# Patient Record
Sex: Male | Born: 1990
Health system: Southern US, Community
[De-identification: ages and names within clinical notes are randomized; demographics above are authoritative.]

## PROBLEM LIST (undated history)

## (undated) DIAGNOSIS — F419 Anxiety disorder, unspecified: Secondary | ICD-10-CM

## (undated) DIAGNOSIS — G809 Cerebral palsy, unspecified: Secondary | ICD-10-CM

## (undated) DIAGNOSIS — F32A Depression, unspecified: Secondary | ICD-10-CM

## (undated) DIAGNOSIS — R569 Unspecified convulsions: Secondary | ICD-10-CM

## (undated) DIAGNOSIS — F411 Generalized anxiety disorder: Secondary | ICD-10-CM

## (undated) DIAGNOSIS — F0781 Postconcussional syndrome: Secondary | ICD-10-CM

## (undated) HISTORY — DX: Postconcussional syndrome: F07.81

## (undated) HISTORY — DX: Anxiety disorder, unspecified: F41.9

## (undated) HISTORY — PX: DEEP BRAIN STIMULATOR PLACEMENT: SHX608

## (undated) HISTORY — DX: Generalized anxiety disorder: F41.1

## (undated) HISTORY — DX: Depression, unspecified: F32.A

## (undated) HISTORY — DX: Unspecified convulsions: R56.9

---
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
## 2008-10-20 ENCOUNTER — Emergency Department (HOSPITAL_COMMUNITY): Admission: EM | Admit: 2008-10-20 | Discharge: 2008-10-21 | Payer: Self-pay | Admitting: Emergency Medicine

## 2008-11-01 ENCOUNTER — Emergency Department (HOSPITAL_COMMUNITY): Admission: EM | Admit: 2008-11-01 | Discharge: 2008-11-01 | Payer: Self-pay | Admitting: Emergency Medicine

## 2008-11-02 ENCOUNTER — Emergency Department (HOSPITAL_COMMUNITY): Admission: EM | Admit: 2008-11-02 | Discharge: 2008-11-02 | Payer: Self-pay | Admitting: Emergency Medicine

## 2008-11-12 ENCOUNTER — Emergency Department (HOSPITAL_COMMUNITY): Admission: EM | Admit: 2008-11-12 | Discharge: 2008-11-12 | Payer: Self-pay | Admitting: Emergency Medicine

## 2009-05-19 ENCOUNTER — Encounter: Admission: RE | Admit: 2009-05-19 | Discharge: 2009-05-19 | Payer: Self-pay | Admitting: Neurology

## 2010-01-04 ENCOUNTER — Inpatient Hospital Stay (HOSPITAL_COMMUNITY): Admission: EM | Admit: 2010-01-04 | Discharge: 2010-01-05 | Payer: Self-pay | Admitting: Emergency Medicine

## 2010-09-10 ENCOUNTER — Encounter: Payer: Self-pay | Admitting: Neurology

## 2010-11-05 LAB — BASIC METABOLIC PANEL
BUN: 20 mg/dL (ref 6–23)
CO2: 28 mEq/L (ref 19–32)
Calcium: 9.4 mg/dL (ref 8.4–10.5)
Chloride: 110 mEq/L (ref 96–112)
Creatinine, Ser: 0.91 mg/dL (ref 0.4–1.5)
GFR calc Af Amer: >60 mL/min (ref >60)
GFR calc non Af Amer: >60 mL/min (ref >60)
Glucose, Bld: 76 mg/dL (ref 70–99)
Potassium: 4.3 mEq/L (ref 3.5–5.1)
Sodium: 141 mEq/L (ref 135–145)

## 2010-11-05 LAB — DIFFERENTIAL
Basophils Absolute: 0 10*3/uL (ref 0.0–0.1)
Basophils Relative: 0 % (ref 0–1)
Eosinophils Absolute: 0 10*3/uL (ref 0.0–0.7)
Eosinophils Relative: 1 % (ref 0–5)
Lymphocytes Relative: 24 % (ref 12–46)
Lymphs Abs: 2.1 10*3/uL (ref 0.7–4.0)
Monocytes Absolute: 0.4 10*3/uL (ref 0.1–1.0)
Monocytes Relative: 5 % (ref 3–12)
Neutro Abs: 6 10*3/uL (ref 1.7–7.7)
Neutrophils Relative %: 70 % (ref 43–77)

## 2010-11-05 LAB — CBC
HCT: 44.5 % (ref 39.0–52.0)
Hemoglobin: 14.9 g/dL (ref 13.0–17.0)
MCHC: 33.6 g/dL (ref 30.0–36.0)
MCV: 96.1 fL (ref 78.0–100.0)
Platelets: 293 10*3/uL (ref 150–400)
RBC: 4.62 MIL/uL (ref 4.22–5.81)
RDW: 13.1 % (ref 11.5–15.5)
WBC: 8.5 10*3/uL (ref 4.0–10.5)

## 2010-11-29 LAB — COMPREHENSIVE METABOLIC PANEL
ALT: 41 U/L (ref 0–53)
AST: 44 U/L — ABNORMAL HIGH (ref 0–37)
Albumin: 4.3 g/dL (ref 3.5–5.2)
Alkaline Phosphatase: 79 U/L (ref 39–117)
BUN: 17 mg/dL (ref 6–23)
CO2: 30 mEq/L (ref 19–32)
Calcium: 9.9 mg/dL (ref 8.4–10.5)
Chloride: 104 mEq/L (ref 96–112)
Creatinine, Ser: 0.83 mg/dL (ref 0.4–1.5)
GFR calc Af Amer: >60 mL/min (ref >60)
GFR calc non Af Amer: >60 mL/min (ref >60)
Glucose, Bld: 84 mg/dL (ref 70–99)
Potassium: 4.7 mEq/L (ref 3.5–5.1)
Sodium: 139 mEq/L (ref 135–145)
Total Bilirubin: 2.3 mg/dL — ABNORMAL HIGH (ref 0.3–1.2)
Total Protein: 7.2 g/dL (ref 6.0–8.3)

## 2010-11-29 LAB — DIFFERENTIAL
Basophils Absolute: 0 10*3/uL (ref 0.0–0.1)
Basophils Relative: 0 % (ref 0–1)
Eosinophils Absolute: 0.1 10*3/uL (ref 0.0–0.7)
Eosinophils Relative: 1 % (ref 0–5)
Lymphocytes Relative: 24 % (ref 12–46)
Lymphs Abs: 1.9 10*3/uL (ref 0.7–4.0)
Monocytes Absolute: 0.6 10*3/uL (ref 0.1–1.0)
Monocytes Relative: 8 % (ref 3–12)
Neutro Abs: 5.4 10*3/uL (ref 1.7–7.7)
Neutrophils Relative %: 68 % (ref 43–77)

## 2010-11-29 LAB — CBC
HCT: 45.3 % (ref 39.0–52.0)
Hemoglobin: 15.4 g/dL (ref 13.0–17.0)
MCHC: 34 g/dL (ref 30.0–36.0)
MCV: 94.8 fL (ref 78.0–100.0)
Platelets: 340 10*3/uL (ref 150–400)
RBC: 4.78 MIL/uL (ref 4.22–5.81)
RDW: 12.9 % (ref 11.5–15.5)
WBC: 8 10*3/uL (ref 4.0–10.5)

## 2010-11-29 LAB — AMMONIA: Ammonia: 28 umol/L (ref 11–35)

## 2010-11-29 LAB — BILIRUBIN, DIRECT: Bilirubin, Direct: 0.2 mg/dL (ref 0.0–0.3)

## 2010-11-29 LAB — CK: Total CK: 824 U/L — ABNORMAL HIGH (ref 7–232)

## 2010-11-29 LAB — MAGNESIUM: Magnesium: 2.1 mg/dL (ref 1.5–2.5)

## 2010-12-11 ENCOUNTER — Emergency Department (HOSPITAL_COMMUNITY)
Admission: EM | Admit: 2010-12-11 | Discharge: 2010-12-11 | Disposition: A | Discharge status: Home / Self Care | Payer: No Typology Code available for payment source | Attending: Emergency Medicine | Admitting: Emergency Medicine

## 2010-12-11 ENCOUNTER — Emergency Department (HOSPITAL_COMMUNITY): Payer: No Typology Code available for payment source

## 2010-12-11 DIAGNOSIS — IMO0002 Reserved for concepts with insufficient information to code with codable children: Secondary | ICD-10-CM | POA: Insufficient documentation

## 2010-12-11 DIAGNOSIS — G809 Cerebral palsy, unspecified: Secondary | ICD-10-CM | POA: Insufficient documentation

## 2010-12-11 DIAGNOSIS — F411 Generalized anxiety disorder: Secondary | ICD-10-CM | POA: Insufficient documentation

## 2010-12-11 DIAGNOSIS — Y9289 Other specified places as the place of occurrence of the external cause: Secondary | ICD-10-CM | POA: Insufficient documentation

## 2012-02-07 ENCOUNTER — Encounter (HOSPITAL_COMMUNITY): Payer: Self-pay | Admitting: Emergency Medicine

## 2012-02-07 ENCOUNTER — Emergency Department (HOSPITAL_COMMUNITY)
Admission: EM | Admit: 2012-02-07 | Discharge: 2012-02-07 | Disposition: A | Discharge status: Home / Self Care | Payer: BC Managed Care – PPO | Attending: Emergency Medicine | Admitting: Emergency Medicine

## 2012-02-07 DIAGNOSIS — G809 Cerebral palsy, unspecified: Secondary | ICD-10-CM | POA: Insufficient documentation

## 2012-02-07 DIAGNOSIS — Z79899 Other long term (current) drug therapy: Secondary | ICD-10-CM | POA: Insufficient documentation

## 2012-02-07 DIAGNOSIS — M79609 Pain in unspecified limb: Secondary | ICD-10-CM | POA: Insufficient documentation

## 2012-02-07 DIAGNOSIS — IMO0001 Reserved for inherently not codable concepts without codable children: Secondary | ICD-10-CM | POA: Insufficient documentation

## 2012-02-07 DIAGNOSIS — M791 Myalgia, unspecified site: Secondary | ICD-10-CM

## 2012-02-07 DIAGNOSIS — R209 Unspecified disturbances of skin sensation: Secondary | ICD-10-CM | POA: Insufficient documentation

## 2012-02-07 DIAGNOSIS — M62838 Other muscle spasm: Secondary | ICD-10-CM | POA: Insufficient documentation

## 2012-02-07 HISTORY — DX: Cerebral palsy, unspecified: G80.9

## 2012-02-07 LAB — BASIC METABOLIC PANEL
BUN: 9 mg/dL (ref 6–23)
CO2: 30 mEq/L (ref 19–32)
Calcium: 9.6 mg/dL (ref 8.4–10.5)
Chloride: 101 mEq/L (ref 96–112)
Creatinine, Ser: 0.87 mg/dL (ref 0.50–1.35)
GFR calc Af Amer: >90 mL/min (ref >90)
GFR calc non Af Amer: >90 mL/min (ref >90)
Glucose, Bld: 93 mg/dL (ref 70–99)
Potassium: 4.3 mEq/L (ref 3.5–5.1)
Sodium: 138 mEq/L (ref 135–145)

## 2012-02-07 MED ORDER — OXYCODONE-ACETAMINOPHEN 5-325 MG PO TABS
2.0000 | ORAL_TABLET | Freq: Once | ORAL | Status: AC
  Administered 2012-02-07: 2 via ORAL
  Filled 2012-02-07: qty 2

## 2012-02-07 MED ORDER — OXYCODONE-ACETAMINOPHEN 5-325 MG PO TABS: 2.0000 | ORAL_TABLET | ORAL | Status: AC | PRN

## 2012-02-07 NOTE — Progress Notes (Signed)
Pt confirmed pcp as Eddie Valentine

## 2012-02-07 NOTE — ED Notes (Signed)
FAO:[id number]<MV> Expected date:<BR> Expected time: 3:00 PM<BR> Means of arrival:Ambulance<BR> Comments:<BR> 20yo M-Hx cerebral palsy, numbness in hands

## 2012-02-07 NOTE — Discharge Instructions (Signed)
Musculoskeletal Pain   Musculoskeletal pain is muscle and boney aches and pains. These pains can occur in any part of the body. Your caregiver may treat you without knowing the cause of the pain. They may treat you if blood or urine tests, X-rays, and other tests were normal.   CAUSES   There is often not a definite cause or reason for these pains. These pains may be caused by a type of germ (virus). The discomfort may also come from overuse. Overuse includes working out too hard when your body is not fit. Boney aches also come from weather changes. Bone is sensitive to atmospheric pressure changes.   HOME CARE INSTRUCTIONS   Ask when your test results will be ready. Make sure you get your test results.   Only take over-the-counter or prescription medicines for pain, discomfort, or fever as directed by your caregiver. If you were given medications for your condition, do not drive, operate machinery or power tools, or sign legal documents for 24 hours. Do not drink alcohol. Do not take sleeping pills or other medications that may interfere with treatment.   Continue all activities unless the activities cause more pain. When the pain lessens, slowly resume normal activities. Gradually increase the intensity and duration of the activities or exercise.   During periods of severe pain, bed rest may be helpful. Lay or sit in any position that is comfortable.   Putting ice on the injured area.   Put ice in a bag.   Place a towel between your skin and the bag.   Leave the ice on for 15 to 20 minutes, 3 to 4 times a day.   Follow up with your caregiver for continued problems and no reason can be found for the pain. If the pain becomes worse or does not go away, it may be necessary to repeat tests or do additional testing. Your caregiver may need to look further for a possible cause.   SEEK IMMEDIATE MEDICAL CARE IF:   You have pain that is getting worse and is not relieved by medications.   You develop chest pain that is  associated with shortness or breath, sweating, feeling sick to your stomach (nauseous), or throw up (vomit).   Your pain becomes localized to the abdomen.   You develop any new symptoms that seem different or that concern you.   MAKE SURE YOU:   Understand these instructions.   Will watch your condition.   Will get help right away if you are not doing well or get worse.   Document Released: 08/05/2005 Document Revised: 07/25/2011 Document Reviewed: 03/25/2008   Exit[employer]® Patient Information ©2012 [employer].

## 2012-02-07 NOTE — ED Provider Notes (Signed)
History     CSN: [id number]  Arrival date & time 02/07/12  1504   First MD Initiated Contact with Patient 02/07/12 1530      Chief Complaint  Patient presents with  . Extremity Pain    (Consider location/radiation/quality/duration/timing/severity/associated sxs/prior treatment) HPI Comments: Pt has a hx of cerebral palsy.  Has some chronic type pain/muscle spasms to arms/legs.  He says that since yesterday, the pain an numbness is worse in his arms/legs.  No neck or back pain.  No weakness to extremities.  Is having more muscle spasms.  No incontinence.  No fevers.  No recent injuries.  Is not taking anything at home for pain.  Has tried lyrica in past, but did not tolerate it.  Patient is a 21 y.o. male presenting with extremity pain.  Extremity Pain This is a recurrent problem. Pertinent negatives include no chest pain, no abdominal pain, no headaches and no shortness of breath.    Past Medical History  Diagnosis Date  . Cerebral palsy     Past Surgical History  Procedure Date  . Deep brain stimulator placement     No family history on file.  History  Substance Use Topics  . Smoking status: Former Games developer  . Smokeless tobacco: Not on file  . Alcohol Use: No      Review of Systems  Constitutional: Negative for fever, chills, diaphoresis and fatigue.  HENT: Negative for congestion, rhinorrhea, sneezing and neck pain.   Eyes: Negative.   Respiratory: Negative for cough, chest tightness and shortness of breath.   Cardiovascular: Negative for chest pain and leg swelling.  Gastrointestinal: Negative for nausea, vomiting, abdominal pain, diarrhea and blood in stool.  Genitourinary: Negative for frequency, hematuria, flank pain and difficulty urinating.  Musculoskeletal: Positive for myalgias. Negative for back pain, joint swelling and arthralgias.  Skin: Negative for rash.  Neurological: Positive for numbness. Negative for dizziness, speech difficulty, weakness and  headaches.    Allergies  Review of patient's allergies indicates no known allergies.  Home Medications   Current Outpatient Rx  Name Route Sig Dispense Refill  . LEVETIRACETAM 500 MG PO TABS Oral Take 500 mg by mouth every 12 (twelve) hours.    Marland Kitchen VITAMIN D (ERGOCALCIFEROL) 50000 UNITS PO CAPS Oral Take 50,000 Units by mouth every 7 (seven) days.    . OXYCODONE-ACETAMINOPHEN 5-325 MG PO TABS Oral Take 2 tablets by mouth every 4 (four) hours as needed for pain. 15 tablet 0    BP 131/73  Pulse 74  Temp 98.7 F (37.1 C) (Oral)  Resp 19  SpO2 93%  Physical Exam  Constitutional: He is oriented to person, place, and time. He appears well-developed and well-nourished.  HENT:  Head: Normocephalic and atraumatic.  Eyes: Pupils are equal, round, and reactive to light.  Neck: Normal range of motion. Neck supple.  Cardiovascular: Normal rate, regular rhythm and normal heart sounds.   Pulmonary/Chest: Effort normal and breath sounds normal. No respiratory distress. He has no wheezes. He has no rales. He exhibits no tenderness.  Abdominal: Soft. Bowel sounds are normal. There is no tenderness. There is no rebound and no guarding.  Musculoskeletal: Normal range of motion. He exhibits no edema.       Has some spasticity to extremities with pain on palpation of muscles.  Good pulses, normal color, no swelling.  No rash.  Normal sensation to LT.  No pain along spine  Lymphadenopathy:    He has no cervical adenopathy.  Neurological: He  is alert and oriented to person, place, and time. He has normal strength. No cranial nerve deficit or sensory deficit. GCS eye subscore is 4. GCS verbal subscore is 5. GCS motor subscore is 6.  Skin: Skin is warm and dry. No rash noted.  Psychiatric: He has a normal mood and affect.    ED Course  Procedures (including critical care time)  Results for orders placed during the hospital encounter of 02/07/12  BASIC METABOLIC PANEL      Component Value Range    Sodium 138  135 - 145 mEq/L   Potassium 4.3  3.5 - 5.1 mEq/L   Chloride 101  96 - 112 mEq/L   CO2 30  19 - 32 mEq/L   Glucose, Bld 93  70 - 99 mg/dL   BUN 9  6 - 23 mg/dL   Creatinine, Ser 1.61  0.50 - 1.35 mg/dL   Calcium 9.6  8.4 - 09.6 mg/dL   GFR calc non Af Amer >90  >90 mL/min   GFR calc Af Amer >90  >90 mL/min   No results found.   No results found.   1. Myalgia       MDM  Pt feeling much better after dose of percocet.  No neuro deficits.  Feel that this is likely from his muscle spasms.  Will give rx for percocet, f/u with his PMD in [facility], MD 02/07/12 1745

## 2012-02-07 NOTE — ED Notes (Signed)
Pt brought in by EMS c/o pain in  arms and legs when asked if he  took anything for pain he stated no Hx CP tremors controlled by his Deep Brain Stimul placed 2009/2010

## 2014-07-08 ENCOUNTER — Ambulatory Visit: Payer: Managed Care, Other (non HMO) | Attending: Neurology | Admitting: Physical Therapy

## 2014-07-08 ENCOUNTER — Encounter: Payer: Self-pay | Admitting: Physical Therapy

## 2014-07-08 DIAGNOSIS — G249 Dystonia, unspecified: Secondary | ICD-10-CM

## 2014-07-08 DIAGNOSIS — R269 Unspecified abnormalities of gait and mobility: Secondary | ICD-10-CM

## 2014-07-08 DIAGNOSIS — R293 Abnormal posture: Secondary | ICD-10-CM | POA: Diagnosis not present

## 2014-07-08 DIAGNOSIS — G809 Cerebral palsy, unspecified: Secondary | ICD-10-CM | POA: Diagnosis not present

## 2014-07-08 NOTE — Therapy (Signed)
  Patient Details  Name: Eddie CullMichael Kavanaugh MRN: [id number] Date of Birth: 07-Jul-1991  Encounter Date: 07/08/2014   See scanned assessment from this date of service.  Clarita CraneStephanie F Shakevia Sarris, PT, DPT 07/08/2014 11:55 AM

## 2015-06-13 ENCOUNTER — Encounter: Payer: Self-pay | Admitting: Physical Therapy

## 2017-03-18 ENCOUNTER — Encounter: Payer: Self-pay | Admitting: Physical Therapy

## 2017-03-18 ENCOUNTER — Ambulatory Visit: Payer: Managed Care, Other (non HMO) | Attending: Family Medicine | Admitting: Physical Therapy

## 2017-03-18 DIAGNOSIS — R252 Cramp and spasm: Secondary | ICD-10-CM

## 2017-03-18 DIAGNOSIS — M6281 Muscle weakness (generalized): Secondary | ICD-10-CM | POA: Diagnosis present

## 2017-03-18 DIAGNOSIS — R2689 Other abnormalities of gait and mobility: Secondary | ICD-10-CM | POA: Diagnosis present

## 2017-03-18 NOTE — Therapy (Signed)
[facility] [address] [place], [place], [address] Phone: [phone]   Fax:  [phone]  Physical Therapy Evaluation  Patient Details  Name: Eddie Valentine MRN: [id number] Date of Birth: 01/24/1991 Referring Provider: Jarrett SohoWharton, Courtney  Encounter Date: 03/18/2017      PT End of Session - 03/18/17 1732    Visit Number 1   Date for PT Re-Evaluation 06/10/17   PT Start Time 1533   PT Stop Time 1613   PT Time Calculation (min) 40 min   Activity Tolerance Patient tolerated treatment well      Past Medical History:  Diagnosis Date  . Cerebral palsy [facility](HCC)     Past Surgical History:  Procedure Laterality Date  . DEEP BRAIN STIMULATOR PLACEMENT      There were no vitals filed for this visit.       Subjective Assessment - 03/18/17 1538    Subjective Pt presents to clinic due to history of CP.  He had a fall outside clinic today and scraped his hand.  He states he sits 7 hours a day and doens't exercise enough.  He would like to walk outside again with confidence and walk again for exercise. Pt has chronic pain in back and legs cramp up.    Pertinent History cerebal palsy, dystonia   Limitations Walking   How long can you walk comfortably? a couple times down and back in hall   Patient Stated Goals get walking better and get pain level down   Currently in Pain? Yes   Pain Score 8    Pain Location Back   Pain Orientation Lower   Pain Descriptors / Indicators Sore   Pain Radiating Towards radiates down legs   Pain Onset More than a month ago   Pain Frequency Intermittent   Aggravating Factors  comes and goes   Pain Relieving Factors stretching, massage   Effect of Pain on Daily Activities can't walk as much as I could before   Multiple Pain Sites No            [facility] PT Assessment - 03/18/17 0001      Assessment   Medical Diagnosis M62.838 - cervical paraspinal muscle spasms   Referring Provider  Jarrett SohoWharton, Courtney   Onset Date/Surgical Date --  birth trauma   Prior Therapy Yes     Precautions   Precautions None     Restrictions   Weight Bearing Restrictions No     Balance Screen   Has the patient fallen in the past 6 months Yes   How many times? 1     Home [employer]   Living Environment Private residence   Living Arrangements Alone     Prior Function   Level of Independence Independent   Vocation Full time employment   Vocation Requirements sitting, desk     Cognition   Overall Cognitive Status Within Functional Limits for tasks assessed     Observation/Other Assessments   Observations flexion throughout upper and lower extrmemities due to muscle spasms     Posture/Postural Control   Posture/Postural Control Postural limitations   Posture Comments increased WB on right side in standing     ROM / Strength   AROM / PROM / Strength Strength     Strength   Overall Strength Comments right LE 4-/5; left LE 4/5     Flexibility   Soft Tissue Assessment /Muscle Length yes   Hamstrings Lt 33 deg; Rt 30 deg  Quadriceps right 50% limited; left 25% limited   ITB right calf 50% limited; left 25% limted     Palpation   Palpation comment high tone throughout LE, spasms in hamstrings, lumbar paraspinals     Ambulation/Gait   Gait Pattern Poor foot clearance - left;Poor foot clearance - right;Right flexed knee in stance;Left flexed knee in stance;Lateral trunk lean to right;Trunk flexed;Right steppage;Left steppage     Standardized Balance Assessment   Five times sit to stand comments  45 seconds     Timed Up and Go Test   Normal TUG (seconds) 17            Objective measurements completed on examination: See above findings.                    PT Short Term Goals - 03/18/17 1753      PT SHORT TERM GOAL #1   Title Pt able to stretch hamstrings up to 40 deg with leg straight due to improved muscle length   Time 4   Period Weeks   Status  New   Target Date 04/15/17     PT SHORT TERM GOAL #2   Title TUG decreased to 14 sec or less due to improved strength and gait   Time 4   Period Weeks   Status New   Target Date 04/15/17     PT SHORT TERM GOAL #3   Title independent with initial HEP   Time 4   Period Weeks   Status New   Target Date 04/15/17           PT Long Term Goals - 03/18/17 1758      PT LONG TERM GOAL #1   Title pt demonstrates TUG < or = to 12 sec for reduced risk of falls due to improved gait mechanics   Time 12   Period Weeks   Status New   Target Date 06/10/17     PT LONG TERM GOAL #2   Title Pt demonstrates 5 x sit to stand < or = to 40 sec without plopping down into his chair due to improved strength and functional movements   Time 12   Period Weeks   Status New   Target Date 06/10/17     PT LONG TERM GOAL #3   Title pt reports 50% improved low back pain   Time 12   Period Weeks   Status New   Target Date 06/10/17     PT LONG TERM GOAL #4   Title independent with advanced HEP   Time 12   Period Weeks   Status New   Target Date 06/10/17                Plan - 03/18/17 1801    Clinical Impression Statement Patient presents to clinic with difficulty walking due to CP aquired at birth.  Pt has recently noticed increased low back pain and having a harder time walking.  He usually gets help walking outside due to instability.  Pt has increased risk of fall demonstrated by TUG and 5 times sit to stand.  Pt has impaired tone, reduced flexibity, and LE weakness bilaterally Right > left.  Pt needs skilled PT to address impairments so he can improve health and mobility for normal activities.   History and Personal Factors relevant to plan of care: CP   Clinical Presentation Evolving   Clinical Presentation due to: pt has regressed since previous PT 10  years ago   Clinical Decision Making Moderate   Rehab Potential Excellent   PT Frequency 2x / week   PT Duration 12 weeks   PT  Treatment/Interventions ADLs/Self Care Home Management;Biofeedback;Cryotherapy;Electrical Stimulation;Moist Heat;Ultrasound;Gait training;Stair training;Functional mobility training;Therapeutic activities;Therapeutic exercise;Balance training;Neuromuscular re-education;Patient/family education;Passive range of motion;Dry needling;Taping;Manual techniques   PT Next Visit Plan lumbar and hip ROM and stretches, LE strengthening, low level balance   Consulted and Agree with Plan of Care Patient      Patient will benefit from skilled therapeutic intervention in order to improve the following deficits and impairments:  Abnormal gait, Decreased balance, Decreased coordination, Decreased range of motion, Impaired flexibility, Difficulty walking, Decreased strength, Pain, Impaired tone  Visit Diagnosis: Cramp and spasm - Plan: PT plan of care cert/re-cert  Other abnormalities of gait and mobility - Plan: PT plan of care cert/re-cert  Muscle weakness (generalized) - Plan: PT plan of care cert/re-cert     Problem List There are no active problems to display for this patient.   Vincente PoliJakki Crosser, PT 03/18/2017, 6:08 PM  [facility] [address], [address] [place], [place], [address] Phone: [phone]   Fax:  [phone]  Name: Eddie CullMichael Morina MRN: [id number] Date of Birth: 11/13/1990

## 2017-04-01 ENCOUNTER — Ambulatory Visit: Payer: Managed Care, Other (non HMO) | Attending: Family Medicine | Admitting: Physical Therapy

## 2017-04-01 DIAGNOSIS — R252 Cramp and spasm: Secondary | ICD-10-CM

## 2017-04-01 DIAGNOSIS — R2689 Other abnormalities of gait and mobility: Secondary | ICD-10-CM | POA: Insufficient documentation

## 2017-04-01 DIAGNOSIS — M6281 Muscle weakness (generalized): Secondary | ICD-10-CM | POA: Insufficient documentation

## 2017-04-01 NOTE — Therapy (Signed)
[facility] [address], [address] [place], [place], [address] Phone: [phone]   Fax:  [phone]  Physical Therapy Treatment  Patient Details  Name: Eddie CullMichael Valentine MRN: [id number] Date of Birth: 04/20/1991 Referring Provider: Jarrett SohoWharton, Courtney  Encounter Date: 04/01/2017      PT End of Session - 04/01/17 1914    Visit Number 2   Date for PT Re-Evaluation 06/10/17   PT Start Time 1445   PT Stop Time 1530   PT Time Calculation (min) 45 min   Activity Tolerance Patient tolerated treatment well      Past Medical History:  Diagnosis Date  . Cerebral palsy [facility](HCC)     Past Surgical History:  Procedure Laterality Date  . DEEP BRAIN STIMULATOR PLACEMENT      There were no vitals filed for this visit.      Subjective Assessment - 04/01/17 1450    Subjective (P)  I was on a plane [place] 7 hours [place] from [place].  I walked outside while on vacation but needed someone to hold my hand.   Currently in Pain? (P)  Yes   Pain Score (P)  7    Pain Location (P)  Back   Pain Type (P)  Chronic pain   Pain Radiating Towards (P)  legs too    Aggravating Factors  (P)  cold weather    Pain Relieving Factors (P)  muscles warm                         [facility] Adult PT Treatment/Exercise - 04/01/17 0001      Knee/Hip Exercises: Aerobic   Nustep 5 min L1      Knee/Hip Exercises: Supine   Other Supine Knee/Hip Exercises assisted lumbar rotation with LEs on green ball    Other Supine Knee/Hip Exercises assisted hip/knee flexion with LEs on green ball     Manual Therapy   Passive ROM Bil gastroc stretch, HS stretch, hip rotators, hip flexors in supine and sidelying                  PT Short Term Goals - 04/01/17 1922      PT SHORT TERM GOAL #1   Title Pt able to stretch hamstrings up to 40 deg with leg straight due to improved muscle length   Time 4   Period Weeks   Status On-going     PT SHORT TERM GOAL #2   Title TUG decreased to 14 sec or less due to improved strength and gait   Time 4   Period Weeks   Status On-going     PT SHORT TERM GOAL #3   Title independent with initial HEP   Time 4   Period Weeks   Status On-going           PT Long Term Goals - 04/01/17 1922      PT LONG TERM GOAL #1   Title pt demonstrates TUG < or = to 12 sec for reduced risk of falls due to improved gait mechanics   Time 12   Period Weeks   Status On-going     PT LONG TERM GOAL #2   Title Pt demonstrates 5 x sit to stand < or = to 40 sec without plopping down into his chair due to improved strength and functional movements   Time 12   Period Weeks   Status On-going     PT  LONG TERM GOAL #3   Title pt reports 50% improved low back pain   Time 12   Period Weeks   Status On-going     PT LONG TERM GOAL #4   Title independent with advanced HEP   Time 12   Period Weeks   Status On-going               Plan - 04/01/17 1915    Clinical Impression Statement The patient reports a decline in status over the last several years.  CP spastic gait increases his risk of falls.  Needs close supervision for safety.  His LBP is increased following prolonged sitting on an airplane yesterday and at work today.  He reports he feels relief with stretching of hip rotators in particular.  Significant reduction in HS soft tissue length secondary to increased tone.     Rehab Potential Excellent   PT Frequency 2x / week   PT Duration 12 weeks   PT Treatment/Interventions ADLs/Self Care Home Management;Biofeedback;Cryotherapy;Electrical Stimulation;Moist Heat;Ultrasound;Gait training;Stair training;Functional mobility training;Therapeutic activities;Therapeutic exercise;Balance training;Neuromuscular re-education;Patient/family education;Passive range of motion;Dry needling;Taping;Manual techniques   PT Next Visit Plan lumbar and hip ROM and stretches, LE strengthening, low level balance       Patient will benefit from skilled therapeutic intervention in order to improve the following deficits and impairments:  Abnormal gait, Decreased balance, Decreased coordination, Decreased range of motion, Impaired flexibility, Difficulty walking, Decreased strength, Pain, Impaired tone  Visit Diagnosis: Cramp and spasm  Other abnormalities of gait and mobility  Muscle weakness (generalized)     Problem List There are no active problems to display for this patient.  Eddie Valentine, PT 04/01/17 7:27 PM Phone: [phone] Fax: [phone]  Eddie Valentine 04/01/2017, 7:26 PM  [facility] [address] [place], [place], [address] Phone: [phone]   Fax:  [phone]  Name: Eddie Valentine MRN: [id number] Date of Birth: 06-22-1991

## 2017-04-03 ENCOUNTER — Ambulatory Visit: Payer: Managed Care, Other (non HMO) | Admitting: Physical Therapy

## 2017-04-03 DIAGNOSIS — R252 Cramp and spasm: Secondary | ICD-10-CM | POA: Diagnosis not present

## 2017-04-03 DIAGNOSIS — R2689 Other abnormalities of gait and mobility: Secondary | ICD-10-CM

## 2017-04-03 NOTE — Therapy (Signed)
[facility] [address], [address] [place], [place], [address] Phone: [phone]   Fax:  [phone]  Physical Therapy Treatment  Patient Details  Name: Eddie Valentine MRN: [id number] Date of Birth: 10/15/1990 Referring Provider: Jarrett SohoWharton, Courtney  Encounter Date: 04/03/2017      PT End of Session - 04/03/17 2324    Visit Number 3   Date for PT Re-Evaluation 06/10/17   PT Start Time 1615   PT Stop Time 1700   PT Time Calculation (min) 45 min   Activity Tolerance Patient tolerated treatment well      Past Medical History:  Diagnosis Date  . Cerebral palsy [facility](HCC)     Past Surgical History:  Procedure Laterality Date  . DEEP BRAIN STIMULATOR PLACEMENT      There were no vitals filed for this visit.      Subjective Assessment - 04/03/17 2320    Subjective The patient states his back is hurting since he has been sitting at work for 5 hours straight today.  He states the last treatment "kicked my tail."  Didn't like the Nu-step.   Currently in Pain? Yes   Pain Score 7    Pain Location Back   Pain Orientation Lower   Aggravating Factors  sitting   Pain Relieving Factors stretching                         [facility] Adult PT Treatment/Exercise - 04/03/17 0001      Manual Therapy   Manual therapy comments supine hip flexor stretch; long axis distraction bil 3x 30 sec   Passive ROM Bil gastroc stretch, HS stretch, hip rotators, hip flexors 3x 20 sec holds ;  bilateral trunk and hip rotation and SKTC                  PT Short Term Goals - 04/03/17 2328      PT SHORT TERM GOAL #1   Title Pt able to stretch hamstrings up to 40 deg with leg straight due to improved muscle length   Time 4   Period Weeks   Status On-going     PT SHORT TERM GOAL #2   Title TUG decreased to 14 sec or less due to improved strength and gait   Time 4   Period Weeks   Status On-going     PT SHORT TERM GOAL #3    Title independent with initial HEP   Time 4   Period Weeks   Status On-going           PT Long Term Goals - 04/03/17 2329      PT LONG TERM GOAL #1   Title pt demonstrates TUG < or = to 12 sec for reduced risk of falls due to improved gait mechanics   Time 12   Period Weeks   Status On-going     PT LONG TERM GOAL #2   Title Pt demonstrates 5 x sit to stand < or = to 40 sec without plopping down into his chair due to improved strength and functional movements   Time 12   Period Weeks   Status On-going     PT LONG TERM GOAL #3   Title pt reports 50% improved low back pain   Time 12   Period Weeks   Status On-going     PT LONG TERM GOAL #4   Title independent with advanced HEP   Time  12   Period Weeks   Status On-going               Plan - 04/03/17 2324    Clinical Impression Statement The patient reports he feels better following stretching to bilateral LEs.  CP spasticity and increased tone with gait impairs balance and contributes to risk of falls.  Good pain relief with manual hip flexor stretches.     Rehab Potential Excellent   PT Frequency 2x / week   PT Duration 12 weeks   PT Treatment/Interventions ADLs/Self Care Home Management;Biofeedback;Cryotherapy;Electrical Stimulation;Moist Heat;Ultrasound;Gait training;Stair training;Functional mobility training;Therapeutic activities;Therapeutic exercise;Balance training;Neuromuscular re-education;Patient/family education;Passive range of motion;Dry needling;Taping;Manual techniques   PT Next Visit Plan lumbar and hip ROM and stretches, LE strengthening, low level balance      Patient will benefit from skilled therapeutic intervention in order to improve the following deficits and impairments:  Abnormal gait, Decreased balance, Decreased coordination, Decreased range of motion, Impaired flexibility, Difficulty walking, Decreased strength, Pain, Impaired tone  Visit Diagnosis: Cramp and spasm  Other  abnormalities of gait and mobility     Problem List There are no active problems to display for this patient.  Lavinia Sharps, PT 04/03/17 11:30 PM Phone: [phone] Fax: [phone]  Vivien Presto 04/03/2017, 11:30 PM  [facility] [address], [address] [place], [place], [address] Phone: [phone]   Fax:  [phone]  Name: Eddie Valentine MRN: [id number] Date of Birth: 15-Jul-1991

## 2017-04-07 ENCOUNTER — Encounter: Payer: Self-pay | Admitting: Physical Therapy

## 2017-04-07 ENCOUNTER — Ambulatory Visit: Payer: Managed Care, Other (non HMO) | Admitting: Physical Therapy

## 2017-04-07 DIAGNOSIS — R2689 Other abnormalities of gait and mobility: Secondary | ICD-10-CM

## 2017-04-07 DIAGNOSIS — R252 Cramp and spasm: Secondary | ICD-10-CM | POA: Diagnosis not present

## 2017-04-07 DIAGNOSIS — M6281 Muscle weakness (generalized): Secondary | ICD-10-CM

## 2017-04-07 NOTE — Patient Instructions (Addendum)
Bridging    Slowly raise buttocks from floor, keeping stomach tight. Repeat _10___ times per set. Do __1__ sets per session. Do _1___ sessions per day.  http://orth.exer.us/1096   Copyright  VHI. All rights reserved.    Knee-to-Chest Stretch: Bilateral    With hands behind knees, pull both knees in to chest until a comfortable stretch is felt in lower back and buttocks. Keep back relaxed. Hold __30__ seconds. Repeat _2___ times per set. Do __1__ sets per session. Do __1__ sessions per day.  http://orth.exer.us/128   Copyright  VHI. All rights reserved.  Knee Rock (Full)    Lie on back, with knees bent and together, feet flat. Slowly lower knees toward the side. Go as far as is comfortable. Hold __30__ seconds. Repeat to other side. Repeat _2___ times. Do _1___ sessions per day.  http://gt2.exer.us/251   Copyright  VHI. All rights reserved.  Lower Back Stretch (Sitting)    Sit in chair with knees spread apart. Bend forward to floor. A comfortable stretch should be felt in lower back. Hold _30___ seconds. Repeat _2___ times per set. Do __1__ sets per session. Do __1__ sessions per day.  http://orth.exer.us/124   Copyright  VHI. All rights reserved.  Sit on chair and twist trunk to one side for 30 seconds 2 time to each side [facility] [address], [address] [place], [place] [address] Phone # [phone] Fax [phone]

## 2017-04-07 NOTE — Therapy (Signed)
[facility] [address] [place], [place], [address] Phone: [phone]   Fax:  [phone]  Physical Therapy Treatment  Patient Details  Name: Eddie Valentine MRN: [id number] Date of Birth: 03-16-1991 Referring Provider: Marda Stalker  Encounter Date: 04/07/2017      PT End of Session - 04/07/17 1620    Visit Number 4   Date for PT Re-Evaluation 06/10/17   Authorization - Visit Number 4   Authorization - Number of Visits 30   PT Start Time 7510   PT Stop Time 1655   PT Time Calculation (min) 40 min   Activity Tolerance Patient tolerated treatment well   Behavior During Therapy [facility] for tasks assessed/performed      Past Medical History:  Diagnosis Date  . Cerebral palsy [facility])     Past Surgical History:  Procedure Laterality Date  . DEEP BRAIN STIMULATOR PLACEMENT      There were no vitals filed for this visit.      Subjective Assessment - 04/07/17 1620    Subjective I feel good when I am here and then it wears off. I have increased pain with sitting for work. I had to use my power wheelchair today due to my legs feeling tight.    Pertinent History cerebal palsy, dystonia   Limitations Walking   How long can you walk comfortably? a couple times down and back in hall   Patient Stated Goals get walking better and get pain level down   Currently in Pain? Yes   Pain Score 7    Pain Location Back   Pain Orientation Lower   Pain Descriptors / Indicators Sore   Pain Type Chronic pain   Pain Radiating Towards radiate down legs   Pain Onset More than a month ago   Pain Frequency Intermittent   Aggravating Factors  sitting   Pain Relieving Factors stretching   Effect of Pain on Daily Activities cannot walk as musch as I could before                         [facility] Adult PT Treatment/Exercise - 04/07/17 0001      Knee/Hip Exercises: Supine   Bridges Limitations 15 with vc to push  right hip up     Manual Therapy   Manual therapy comments supine hip flexor stretch; long axis distraction bil 3x 30 sec   Passive ROM Bil gastroc stretch, HS stretch, hip rotators, hip flexors 3x 20 sec holds ;  bilateral trunk and hip rotation and SKTC                PT Education - 04/07/17 1655    Education provided Yes   Education Details bridge and stretches   Person(s) Educated Patient   Methods Explanation;Demonstration;Verbal cues;Handout   Comprehension Verbalized understanding;Returned demonstration          PT Short Term Goals - 04/07/17 1637      PT SHORT TERM GOAL #1   Title Pt able to stretch hamstrings up to 40 deg with leg straight due to improved muscle length   Time 4   Period Weeks   Status On-going     PT SHORT TERM GOAL #2   Title TUG decreased to 14 sec or less due to improved strength and gait   Time 4   Period Weeks   Status On-going     PT SHORT TERM GOAL #3   Title  independent with initial HEP   Time 4   Period Weeks   Status On-going           PT Long Term Goals - 04/03/17 2329      PT LONG TERM GOAL #1   Title pt demonstrates TUG < or = to 12 sec for reduced risk of falls due to improved gait mechanics   Time 12   Period Weeks   Status On-going     PT LONG TERM GOAL #2   Title Pt demonstrates 5 x sit to stand < or = to 40 sec without plopping down into his chair due to improved strength and functional movements   Time 12   Period Weeks   Status On-going     PT LONG TERM GOAL #3   Title pt reports 50% improved low back pain   Time 12   Period Weeks   Status On-going     PT LONG TERM GOAL #4   Title independent with advanced HEP   Time 12   Period Weeks   Status On-going               Plan - 04/07/17 1656    Clinical Impression Statement Patient has not met goals yet.  He was instructed on a HEP for stretches.  Patient feels better with physical therapy but now knows how to stretch himself to continue  with elongating hsi muscles.  CP spasticity and ind increased tone with gait impairs balance and contributes to risk of falls.     Rehab Potential Excellent   Clinical Impairments Affecting Rehab Potential CP   PT Frequency 2x / week   PT Duration 12 weeks   PT Next Visit Plan lumbar and hip ROM and stretches, LE strengthening, low level balance   Recommended Other Services MD signed initial summary   Consulted and Agree with Plan of Care Patient      Patient will benefit from skilled therapeutic intervention in order to improve the following deficits and impairments:  Abnormal gait, Decreased balance, Decreased coordination, Decreased range of motion, Impaired flexibility, Difficulty walking, Decreased strength, Pain, Impaired tone  Visit Diagnosis: Cramp and spasm  Other abnormalities of gait and mobility  Muscle weakness (generalized)     Problem List There are no active problems to display for this patient.   Earlie Counts, PT 04/07/17 4:59 PM   [facility] [address], [place], [place], [address] Phone: [phone]   Fax:  [phone]  Name: Eddie Valentine MRN: [id number] Date of Birth: 1991/02/01

## 2017-04-09 ENCOUNTER — Ambulatory Visit: Payer: Managed Care, Other (non HMO) | Admitting: Physical Therapy

## 2017-04-09 ENCOUNTER — Encounter: Payer: Self-pay | Admitting: Physical Therapy

## 2017-04-09 DIAGNOSIS — M6281 Muscle weakness (generalized): Secondary | ICD-10-CM

## 2017-04-09 DIAGNOSIS — R2689 Other abnormalities of gait and mobility: Secondary | ICD-10-CM

## 2017-04-09 DIAGNOSIS — R252 Cramp and spasm: Secondary | ICD-10-CM | POA: Diagnosis not present

## 2017-04-09 NOTE — Therapy (Signed)
[facility] [address] [place], [place], [address] Phone: [phone]   Fax:  [phone]  Physical Therapy Treatment  Patient Details  Name: Eddie Valentine MRN: [id number] Date of Birth: 1991-06-27 Referring Provider: Jarrett Soho  Encounter Date: 04/09/2017      PT End of Session - 04/09/17 1658    Visit Number 5   Date for PT Re-Evaluation 06/10/17   Authorization - Visit Number 5   Authorization - Number of Visits 30   PT Start Time 1615   PT Stop Time 1655   PT Time Calculation (min) 40 min   Activity Tolerance Patient tolerated treatment well   Behavior During Therapy [facility] for tasks assessed/performed      Past Medical History:  Diagnosis Date  . Cerebral palsy [facility])     Past Surgical History:  Procedure Laterality Date  . DEEP BRAIN STIMULATOR PLACEMENT      There were no vitals filed for this visit.      Subjective Assessment - 04/09/17 1618    Subjective I had a good workout last visit. I have not done my exercises at home due to doing a project.    Pertinent History cerebal palsy, dystonia   Limitations Walking   How long can you walk comfortably? a couple times down and back in hall   Patient Stated Goals get walking better and get pain level down   Currently in Pain? Yes   Pain Score 6    Pain Location Back   Pain Orientation Lower   Pain Descriptors / Indicators Sore   Pain Type Chronic pain   Pain Radiating Towards radiate down legs   Pain Onset More than a month ago   Pain Frequency Intermittent   Aggravating Factors  sitting   Pain Relieving Factors stretching   Effect of Pain on Daily Activities cannot walk as musch as I could before   Multiple Pain Sites No                         [facility] Adult PT Treatment/Exercise - 04/09/17 0001      Ambulation/Gait   Gait Comments walking with verbal cues  for 1 min 2 times     Knee/Hip Exercises: Standing   SLS at counter hold for 10 seconds with verbal cues to not push hips out 5x each side   Other Standing Knee Exercises tandem stance hold 15 seconds 5x each side;      Knee/Hip Exercises: Supine   Bridges Limitations 15 with vc to push right hip up   Other Supine Knee/Hip Exercises assisted lumbar rotation with LEs on green ball    Other Supine Knee/Hip Exercises assisted hip/knee flexion with LEs on green ball     Manual Therapy   Manual therapy comments supine hip flexor stretch; long axis distraction bil 3x 30 sec   Passive ROM Bil gastroc stretch, HS stretch, hip rotators, hip flexors 3x 20 sec holds ;  bilateral trunk and hip rotation and SKTC                PT Education - 04/09/17 1653    Education provided Yes   Education Details walking 1 min at home 2 times per day   Person(s) Educated Patient   Methods Explanation   Comprehension Verbalized understanding          PT Short Term Goals - 04/07/17 1637      PT  SHORT TERM GOAL #1   Title Pt able to stretch hamstrings up to 40 deg with leg straight due to improved muscle length   Time 4   Period Weeks   Status On-going     PT SHORT TERM GOAL #2   Title TUG decreased to 14 sec or less due to improved strength and gait   Time 4   Period Weeks   Status On-going     PT SHORT TERM GOAL #3   Title independent with initial HEP   Time 4   Period Weeks   Status On-going           PT Long Term Goals - 04/03/17 2329      PT LONG TERM GOAL #1   Title pt demonstrates TUG < or = to 12 sec for reduced risk of falls due to improved gait mechanics   Time 12   Period Weeks   Status On-going     PT LONG TERM GOAL #2   Title Pt demonstrates 5 x sit to stand < or = to 40 sec without plopping down into his chair due to improved strength and functional movements   Time 12   Period Weeks   Status On-going     PT LONG TERM GOAL #3   Title pt reports 50% improved low back pain   Time 12   Period Weeks   Status  On-going     PT LONG TERM GOAL #4   Title independent with advanced HEP   Time 12   Period Weeks   Status On-going               Plan - 04/09/17 1619    Clinical Impression Statement Patient has not been doing his HEP due to project due at work.  After therapy patient is loser and able to walk straighter and pick up his feet.  Patient is encouraged to walk for 1 minutes at home to build up his endurance and strength.  Patient will benefit from skilled therapy to improve balance and strength.    Rehab Potential Excellent   Clinical Impairments Affecting Rehab Potential CP   PT Frequency 2x / week   PT Duration 12 weeks   PT Treatment/Interventions ADLs/Self Care Home Management;Biofeedback;Cryotherapy;Electrical Stimulation;Moist Heat;Ultrasound;Gait training;Stair training;Functional mobility training;Therapeutic activities;Therapeutic exercise;Balance training;Neuromuscular re-education;Patient/family education;Passive range of motion;Dry needling;Taping;Manual techniques   PT Next Visit Plan lumbar and hip ROM and stretches, LE strengthening, low level balance; gait training to build up endurance   PT Home Exercise Plan progress as needed   Consulted and Agree with Plan of Care Patient      Patient will benefit from skilled therapeutic intervention in order to improve the following deficits and impairments:  Abnormal gait, Decreased balance, Decreased coordination, Decreased range of motion, Impaired flexibility, Difficulty walking, Decreased strength, Pain, Impaired tone  Visit Diagnosis: Cramp and spasm  Other abnormalities of gait and mobility  Muscle weakness (generalized)     Problem List There are no active problems to display for this patient.   Eulis Foster, PT 04/09/17 5:01 PM   [facility] [address] [place], [place], [address] Phone: [phone]   Fax:  [phone]  Name: Eddie Valentine MRN: [id number] Date of Birth: 1991-01-03

## 2017-04-14 ENCOUNTER — Ambulatory Visit: Payer: Managed Care, Other (non HMO)

## 2017-04-14 DIAGNOSIS — R252 Cramp and spasm: Secondary | ICD-10-CM | POA: Diagnosis not present

## 2017-04-14 DIAGNOSIS — M6281 Muscle weakness (generalized): Secondary | ICD-10-CM

## 2017-04-14 DIAGNOSIS — R2689 Other abnormalities of gait and mobility: Secondary | ICD-10-CM

## 2017-04-14 NOTE — Therapy (Signed)
[facility] [address] [place], [place], [address] Phone: [phone]   Fax:  [phone]  Physical Therapy Treatment  Patient Details  Name: Eddie Valentine MRN: [id number] Date of Birth: 07/14/1991 Referring Provider: Jarrett Soho  Encounter Date: 04/14/2017      PT End of Session - 04/14/17 1646    Visit Number 6   Date for PT Re-Evaluation 06/10/17   Authorization - Visit Number 6   Authorization - Number of Visits 30   PT Start Time 1612   PT Stop Time 1653   PT Time Calculation (min) 41 min   Activity Tolerance Patient tolerated treatment well   Behavior During Therapy [facility] for tasks assessed/performed      Past Medical History:  Diagnosis Date  . Cerebral palsy [facility])     Past Surgical History:  Procedure Laterality Date  . DEEP BRAIN STIMULATOR PLACEMENT      There were no vitals filed for this visit.      Subjective Assessment - 04/14/17 1614    Subjective I have been working too much.     Currently in Pain? Yes   Pain Score 8    Pain Location Back   Pain Orientation Lower   Pain Descriptors / Indicators Sore   Pain Type Chronic pain   Pain Onset More than a month ago   Pain Frequency Intermittent   Aggravating Factors  sitting, constant   Pain Relieving Factors stertching            [facility] PT Assessment - 04/14/17 0001      Flexibility   Hamstrings Lt 34, Rt 30 degrees                     [facility] Adult PT Treatment/Exercise - 04/14/17 0001      Ambulation/Gait   Gait Comments walking with verbal cues  for 2 minutes, 20 seconds     Exercises   Exercises Knee/Hip;Lumbar     Knee/Hip Exercises: Standing   SLS at counter hold for 10 seconds with verbal cues to not push hips out 5x each side   Other Standing Knee Exercises tandem stance hold 15 seconds 5x each side;      Knee/Hip Exercises: Seated   Sit to Sand 20 reps;without UE support     Knee/Hip  Exercises: Supine   Bridges Limitations 15 with verbal cues for technique   Other Supine Knee/Hip Exercises assisted lumbar rotation with LEs on green ball    Other Supine Knee/Hip Exercises assisted hip/knee flexion with LEs on green ball     Manual Therapy   Manual therapy comments hip flexion, IR and ER bil. P/ROM   Passive ROM Bil gastroc stretch, HS stretch, hip rotators, hip flexors 3x 20 sec holds ;  bilateral trunk and hip rotation and SKTC                  PT Short Term Goals - 04/14/17 1617      PT SHORT TERM GOAL #1   Title Pt able to stretch hamstrings up to 40 deg with leg straight due to improved muscle length   Time 4   Period Weeks   Status On-going     PT SHORT TERM GOAL #3   Title independent with initial HEP   Time 4   Period Weeks   Status On-going           PT Long Term Goals - 04/03/17  2329      PT LONG TERM GOAL #1   Title pt demonstrates TUG < or = to 12 sec for reduced risk of falls due to improved gait mechanics   Time 12   Period Weeks   Status On-going     PT LONG TERM GOAL #2   Title Pt demonstrates 5 x sit to stand < or = to 40 sec without plopping down into his chair due to improved strength and functional movements   Time 12   Period Weeks   Status On-going     PT LONG TERM GOAL #3   Title pt reports 50% improved low back pain   Time 12   Period Weeks   Status On-going     PT LONG TERM GOAL #4   Title independent with advanced HEP   Time 12   Period Weeks   Status On-going               Plan - 04/14/17 1637    Clinical Impression Statement Pt has not been doing exercise due to being too busy. Pt with continued Rt>Lt hip flexibility.  Pt able to perform level bridging today.  Pt has been working on walking at home for 1 minute to help build endurance.  5x sit to stand is 26 seconds with some plopping.  Pt will continue to benefit from skilled PT for flexibility, gait and strength.     Rehab Potential Excellent    PT Duration 12 weeks   PT Treatment/Interventions ADLs/Self Care Home Management;Biofeedback;Cryotherapy;Electrical Stimulation;Moist Heat;Ultrasound;Gait training;Stair training;Functional mobility training;Therapeutic activities;Therapeutic exercise;Balance training;Neuromuscular re-education;Patient/family education;Passive range of motion;Dry needling;Taping;Manual techniques   PT Next Visit Plan lumbar and hip ROM and stretches, LE strengthening, low level balance; gait training to build up endurance   Consulted and Agree with Plan of Care Patient      Patient will benefit from skilled therapeutic intervention in order to improve the following deficits and impairments:  Abnormal gait, Decreased balance, Decreased coordination, Decreased range of motion, Impaired flexibility, Difficulty walking, Decreased strength, Pain, Impaired tone  Visit Diagnosis: Cramp and spasm  Other abnormalities of gait and mobility  Muscle weakness (generalized)     Problem List There are no active problems to display for this patient.   Lorrene Reid, PT 04/14/17 4:57 PM  [facility] [address] [place], [place], [address] Phone: [phone]   Fax:  [phone]  Name: Daquawn Seelman MRN: [id number] Date of Birth: May 01, 1991

## 2017-04-16 ENCOUNTER — Ambulatory Visit: Payer: Managed Care, Other (non HMO)

## 2017-04-16 DIAGNOSIS — R252 Cramp and spasm: Secondary | ICD-10-CM | POA: Diagnosis not present

## 2017-04-16 DIAGNOSIS — R2689 Other abnormalities of gait and mobility: Secondary | ICD-10-CM

## 2017-04-16 DIAGNOSIS — M6281 Muscle weakness (generalized): Secondary | ICD-10-CM

## 2017-04-16 NOTE — Therapy (Signed)
[facility] [address] [place], [place], [address] Phone: [phone]   Fax:  [phone]  Physical Therapy Treatment  Patient Details  Name: Dontai Pember MRN: [id number] Date of Birth: [birth year] Referring Provider: Jarrett Soho  Encounter Date: 04/16/2017      PT End of Session - 04/16/17 1644    Visit Number 7   Date for PT Re-Evaluation 06/10/17   Authorization - Visit Number 7   PT Start Time 1603   PT Stop Time 1644   PT Time Calculation (min) 41 min   Activity Tolerance Patient tolerated treatment well   Behavior During Therapy [facility] for tasks assessed/performed      Past Medical History:  Diagnosis Date  . Cerebral palsy [facility])     Past Surgical History:  Procedure Laterality Date  . DEEP BRAIN STIMULATOR PLACEMENT      There were no vitals filed for this visit.      Subjective Assessment - 04/16/17 1605    Subjective I worked today.     Patient Stated Goals get walking better and get pain level down   Pain Score 6    Pain Location Back   Pain Orientation Lower   Pain Descriptors / Indicators Sore   Pain Type Chronic pain   Pain Onset More than a month ago   Pain Frequency Intermittent                         [facility] Adult PT Treatment/Exercise - 04/16/17 0001      Ambulation/Gait   Gait Comments walking with verbal cues  for 2 minutes, 25 seconds-3 laps     Lumbar Exercises: Seated   Sit to Stand 10 reps     Lumbar Exercises: Sidelying   Clam --  bilaterally.  Assistance with Rt LE by PT     Knee/Hip Exercises: Standing   SLS at counter hold for 10 seconds with verbal cues to not push hips out 5x each side   Other Standing Knee Exercises tandem stance hold 15 seconds 5x each side;      Knee/Hip Exercises: Supine   Bridges Limitations 15 with verbal cues for technique   Other Supine Knee/Hip Exercises assisted lumbar rotation with LEs on green ball    Other Supine Knee/Hip Exercises assisted hip/knee flexion with LEs on green ball                  PT Short Term Goals - 04/14/17 1617      PT SHORT TERM GOAL #1   Title Pt able to stretch hamstrings up to 40 deg with leg straight due to improved muscle length   Time 4   Period Weeks   Status On-going     PT SHORT TERM GOAL #3   Title independent with initial HEP   Time 4   Period Weeks   Status On-going           PT Long Term Goals - 04/03/17 2329      PT LONG TERM GOAL #1   Title pt demonstrates TUG < or = to 12 sec for reduced risk of falls due to improved gait mechanics   Time 12   Period Weeks   Status On-going     PT LONG TERM GOAL #2   Title Pt demonstrates 5 x sit to stand < or = to 40 sec without plopping down into his chair due to improved  strength and functional movements   Time 12   Period Weeks   Status On-going     PT LONG TERM GOAL #3   Title pt reports 50% improved low back pain   Time 12   Period Weeks   Status On-going     PT LONG TERM GOAL #4   Title independent with advanced HEP   Time 12   Period Weeks   Status On-going               Plan - 04/16/17 1619    Clinical Impression Statement Pt has not been doing his HEP.  Pt with improved flexibility today with P/ROM.  Pt able to perform level bridging today.  Pt has been working on endurance at home by walking.  Pt with improved sit to stand transition today.  Pt with spasticity and limited strength due CP and will benefit from skilled PT for strength, flexibility and endurance/gait.     Rehab Potential Excellent   PT Frequency 2x / week   PT Duration 12 weeks   PT Treatment/Interventions ADLs/Self Care Home Management;Biofeedback;Cryotherapy;Electrical Stimulation;Moist Heat;Ultrasound;Gait training;Stair training;Functional mobility training;Therapeutic activities;Therapeutic exercise;Balance training;Neuromuscular re-education;Patient/family education;Passive range of  motion;Dry needling;Taping;Manual techniques   PT Next Visit Plan lumbar and hip ROM and stretches, LE strengthening, low level balance; gait training to build up endurance   Consulted and Agree with Plan of Care Patient      Patient will benefit from skilled therapeutic intervention in order to improve the following deficits and impairments:  Abnormal gait, Decreased balance, Decreased coordination, Decreased range of motion, Impaired flexibility, Difficulty walking, Decreased strength, Pain, Impaired tone  Visit Diagnosis: Cramp and spasm  Other abnormalities of gait and mobility  Muscle weakness (generalized)     Problem List There are no active problems to display for this patient.   Lorrene ReidKelly Takacs, PT 04/16/17 4:46 PM  [facility] [address], [address] [place], [place], [address] Phone: [phone]   Fax:  [phone]  Name: Gershon CullMichael Escajeda MRN: [id number] Date of Birth: [birth year]

## 2017-04-22 ENCOUNTER — Ambulatory Visit: Payer: Managed Care, Other (non HMO) | Attending: Family Medicine | Admitting: Physical Therapy

## 2017-04-22 DIAGNOSIS — R2689 Other abnormalities of gait and mobility: Secondary | ICD-10-CM | POA: Diagnosis present

## 2017-04-22 DIAGNOSIS — M6281 Muscle weakness (generalized): Secondary | ICD-10-CM | POA: Diagnosis present

## 2017-04-22 DIAGNOSIS — R252 Cramp and spasm: Secondary | ICD-10-CM | POA: Insufficient documentation

## 2017-04-22 NOTE — Therapy (Signed)
[facility] [address] [place], [place], [address] Phone: [phone]   Fax:  [phone]  Physical Therapy Treatment  Patient Details  Name: Eddie Valentine MRN: [id number] Date of Birth: March 30, 1991 Referring Provider: Jarrett Soho  Encounter Date: 04/22/2017      PT End of Session - 04/22/17 1403    Visit Number 8   Date for PT Re-Evaluation 06/10/17   Authorization - Visit Number 8   Authorization - Number of Visits 30   PT Start Time 1230   PT Stop Time 1315   PT Time Calculation (min) 45 min   Activity Tolerance Patient tolerated treatment well      Past Medical History:  Diagnosis Date  . Cerebral palsy [facility])     Past Surgical History:  Procedure Laterality Date  . DEEP BRAIN STIMULATOR PLACEMENT      There were no vitals filed for this visit.      Subjective Assessment - 04/22/17 1400    Subjective Patient reports he had a massage yesterday which helped.  States people have noticed he is walking better.  States he is going to have surgery "to have my batteries replaced" on 9/12.     Pertinent History cerebal palsy, dystonia   Patient Stated Goals get walking better and get pain level down   Currently in Pain? Yes   Pain Score 6    Pain Location Back   Pain Orientation Lower   Pain Type Chronic pain   Aggravating Factors  sitting at work                         [facility] Adult PT Treatment/Exercise - 04/22/17 0001      Lumbar Exercises: Stretches   Active Hamstring Stretch 3 reps;20 seconds  seated     Knee/Hip Exercises: Standing   Hip Abduction Right;Left;5 reps   Hip Extension AROM;Right;Left;5 reps   SLS at counter hold for 10 seconds with verbal cues to not push hips out 5x each side   Gait Training 5 laps 80 feet each;  1 lap with RW with close supervision   Other Standing Knee Exercises tandem stance hold 15 seconds 5x each side;      Knee/Hip Exercises:  Seated   Clamshell with TheraBand Green 10x   Other Seated Knee/Hip Exercises UE Ranger 3 ways;  Seated flexion stretch 2x     Manual Therapy   Passive ROM Bil gastroc stretch, HS stretch, hip rotators, hip flexors 3x 20 sec holds ;  bilateral trunk and hip rotation and SKTC                  PT Short Term Goals - 04/22/17 1409      PT SHORT TERM GOAL #1   Title Pt able to stretch hamstrings up to 40 deg with leg straight due to improved muscle length   Status Achieved     PT SHORT TERM GOAL #2   Title TUG decreased to 14 sec or less due to improved strength and gait   Time 4   Period Weeks   Status On-going     PT SHORT TERM GOAL #3   Title independent with initial HEP   Time 4   Period Weeks   Status On-going           PT Long Term Goals - 04/22/17 1410      PT LONG TERM GOAL #1  Title pt demonstrates TUG < or = to 12 sec for reduced risk of falls due to improved gait mechanics   Time 12   Period Weeks   Status On-going     PT LONG TERM GOAL #2   Title Pt demonstrates 5 x sit to stand < or = to 40 sec without plopping down into his chair due to improved strength and functional movements   Time 12   Period Weeks   Status On-going     PT LONG TERM GOAL #3   Title pt reports 50% improved low back pain   Time 12   Period Weeks   Status On-going     PT LONG TERM GOAL #4   Title independent with advanced HEP   Time 12   Period Weeks   Status On-going               Plan - 04/22/17 1404    Clinical Impression Statement The patient has much less spasticity and therefore improved muscle length.  His spasticity with gait continues to put him at high risk for falls.  Discussed the possibility of using a rolling walker and he states he last used one when he was [age] years old.  He did not feel stable with the rolling walker in the clinic stating "it felt too light."  He did seem receptive in researching rolling walker options for improved gait safety  and endurance for walking longer lengths of time.     Rehab Potential Excellent   Clinical Impairments Affecting Rehab Potential CP   PT Frequency 2x / week   PT Duration 12 weeks   PT Next Visit Plan recheck TUG for short term goal;  lumbar and hip ROM and stretches, LE strengthening, low level balance; gait training to build up endurance;  rolling walker options;  surgery 9/12      Patient will benefit from skilled therapeutic intervention in order to improve the following deficits and impairments:  Abnormal gait, Decreased balance, Decreased coordination, Decreased range of motion, Impaired flexibility, Difficulty walking, Decreased strength, Pain, Impaired tone  Visit Diagnosis: Cramp and spasm  Other abnormalities of gait and mobility  Muscle weakness (generalized)     Problem List There are no active problems to display for this patient.  Lavinia SharpsStacy Simpson, PT 04/22/17 2:12 PM Phone: [phone] Fax: [phone]  Vivien PrestoSimpson, Stacy C 04/22/2017, 2:11 PM  [facility] [address], [address] [place], [place], [address] Phone: [phone]   Fax:  [phone]  Name: Eddie Valentine MRN: [id number] Date of Birth: Aug 13, 1991

## 2017-04-24 ENCOUNTER — Ambulatory Visit: Payer: Managed Care, Other (non HMO) | Admitting: Physical Therapy

## 2017-04-24 DIAGNOSIS — R252 Cramp and spasm: Secondary | ICD-10-CM | POA: Diagnosis not present

## 2017-04-24 DIAGNOSIS — R2689 Other abnormalities of gait and mobility: Secondary | ICD-10-CM

## 2017-04-24 DIAGNOSIS — M6281 Muscle weakness (generalized): Secondary | ICD-10-CM

## 2017-04-24 NOTE — Therapy (Signed)
[facility] [address], [address] [place], [place], [address] Phone: [phone]   Fax:  [phone]  Physical Therapy Treatment  Patient Details  Name: Eddie Valentine MRN: [id number] Date of Birth: Jan 22, 1991 Referring Provider: Jarrett Soho  Encounter Date: 04/24/2017      PT End of Session - 04/24/17 1351    Visit Number 9   Date for PT Re-Evaluation 06/10/17   Authorization - Number of Visits 30   PT Start Time 1230   PT Stop Time 1315   PT Time Calculation (min) 45 min   Activity Tolerance Patient tolerated treatment well      Past Medical History:  Diagnosis Date  . Cerebral palsy [facility])     Past Surgical History:  Procedure Laterality Date  . DEEP BRAIN STIMULATOR PLACEMENT      There were no vitals filed for this visit.      Subjective Assessment - 04/24/17 1258    Subjective Not too bad today.  That massage on Monday really helped.  Everybody says I'm doing better.     Pertinent History cerebal palsy, dystonia   Currently in Pain? Yes   Pain Score 5    Pain Location Back   Pain Type Chronic pain   Aggravating Factors  sitting at work                         [facility] Adult PT Treatment/Exercise - 04/24/17 0001      Lumbar Exercises: Seated   Sit to Stand Limitations flexion stretch rolling red ball 5x     Lumbar Exercises: Supine   Clam 10 reps   Clam Limitations green band   Bridge 10 reps   Other Supine Lumbar Exercises whole leg press down 5x right/left   Other Supine Lumbar Exercises leg lengthener 5x right/left     Knee/Hip Exercises: Supine   Other Supine Knee/Hip Exercises assisted lumbar rotation with LEs on green ball    Other Supine Knee/Hip Exercises assisted hip/knee flexion with LEs on green ball     Manual Therapy   Manual therapy comments seated assisted thoracic extenison/pectoral stetch red ball   discontinued secondary to complaint of low back pain   Passive ROM Bil gastroc stretch, HS stretch, hip rotators, hip flexors 3x 20 sec holds ;  bilateral trunk and hip rotation and SKTC                  PT Short Term Goals - 04/24/17 1356      PT SHORT TERM GOAL #1   Title Pt able to stretch hamstrings up to 40 deg with leg straight due to improved muscle length   Status Achieved     PT SHORT TERM GOAL #2   Title TUG decreased to 14 sec or less due to improved strength and gait   Time 4   Period Weeks   Status On-going     PT SHORT TERM GOAL #3   Title independent with initial HEP   Time 4   Period Weeks   Status On-going           PT Long Term Goals - 04/24/17 1356      PT LONG TERM GOAL #1   Title pt demonstrates TUG < or = to 12 sec for reduced risk of falls due to improved gait mechanics   Time 12   Period Weeks   Status On-going     PT  LONG TERM GOAL #2   Title Pt demonstrates 5 x sit to stand < or = to 40 sec without plopping down into his chair due to improved strength and functional movements   Time 12   Period Weeks   Status On-going     PT LONG TERM GOAL #3   Title pt reports 50% improved low back pain   Time 12   Period Weeks   Status On-going     PT LONG TERM GOAL #4   Title independent with advanced HEP   Time 12   Period Weeks   Status On-going               Plan - 04/24/17 1352    Clinical Impression Statement The patient has decreased overall spasticity.  He does complain of back pain today particularly with gentle stretching in sitting to encourage  thoracic extension.  He declines standing exercises today secondary to pain.  Will continue ongoing assessment to determine an appropriate walking device--possibly a restorator or device with a very sturdy frame.  He is having an upcoming surgery but states he will not need to miss therapy.   His mom will be coming to visit next week.     Rehab Potential Excellent   Clinical Impairments Affecting Rehab Potential CP   PT Frequency 2x  / week   PT Duration 12 weeks   PT Treatment/Interventions ADLs/Self Care Home Management;Biofeedback;Cryotherapy;Electrical Stimulation;Moist Heat;Ultrasound;Gait training;Stair training;Functional mobility training;Therapeutic activities;Therapeutic exercise;Balance training;Neuromuscular re-education;Patient/family education;Passive range of motion;Dry needling;Taping;Manual techniques   PT Next Visit Plan recheck TUG for short term goal;  lumbar and hip ROM and stretches, LE strengthening, low level balance; gait training to build up endurance;  rolling walker options;  surgery 9/12      Patient will benefit from skilled therapeutic intervention in order to improve the following deficits and impairments:  Abnormal gait, Decreased balance, Decreased coordination, Decreased range of motion, Impaired flexibility, Difficulty walking, Decreased strength, Pain, Impaired tone  Visit Diagnosis: Cramp and spasm  Other abnormalities of gait and mobility  Muscle weakness (generalized)     Problem List There are no active problems to display for this patient.  Lavinia SharpsStacy Porscha Axley, PT 04/24/17 1:57 PM Phone: [phone] Fax: [phone]  Vivien PrestoSimpson, Brandye Inthavong C 04/24/2017, 1:57 PM  [facility] [address], [address] [place], [place], [address] Phone: [phone]   Fax:  [phone]  Name: Eddie CullMichael Valentine MRN: [id number] Date of Birth: 1991-04-11

## 2017-04-28 ENCOUNTER — Ambulatory Visit: Payer: Managed Care, Other (non HMO)

## 2017-04-28 DIAGNOSIS — R252 Cramp and spasm: Secondary | ICD-10-CM

## 2017-04-28 DIAGNOSIS — M6281 Muscle weakness (generalized): Secondary | ICD-10-CM

## 2017-04-28 DIAGNOSIS — R2689 Other abnormalities of gait and mobility: Secondary | ICD-10-CM

## 2017-04-28 NOTE — Therapy (Signed)
[facility] [address], [address] [place], [place], [address] Phone: [phone]   Fax:  [phone]  Physical Therapy Treatment  Patient Details  Name: Eddie Valentine MRN: [id number] Date of Birth: 12-16-1990 Referring Provider: Jarrett Soho  Encounter Date: 04/28/2017      PT End of Session - 04/28/17 1647    Visit Number 10   Date for PT Re-Evaluation 06/10/17   Authorization - Visit Number 10   Authorization - Number of Visits 30   PT Start Time 1616   PT Stop Time 1650   PT Time Calculation (min) 34 min   Activity Tolerance Patient tolerated treatment well;Patient limited by pain   Behavior During Therapy [facility] for tasks assessed/performed      Past Medical History:  Diagnosis Date  . Cerebral palsy [facility])     Past Surgical History:  Procedure Laterality Date  . DEEP BRAIN STIMULATOR PLACEMENT      There were no vitals filed for this visit.      Subjective Assessment - 04/28/17 1622    Subjective I am having surgery on Thursday to have battery replaced in implanted device.     Pertinent History cerebal palsy, dystonia   Currently in Pain? Yes   Pain Score 7    Pain Location Back   Pain Orientation Lower   Pain Descriptors / Indicators Sore   Pain Type Chronic pain   Pain Onset More than a month ago   Pain Frequency Intermittent   Aggravating Factors  sitting at work, this morning   Pain Relieving Factors stretching            [facility] PT Assessment - 04/28/17 0001      Timed Up and Go Test   TUG Normal TUG   Normal TUG (seconds) 20.73                     [facility] Adult PT Treatment/Exercise - 04/28/17 0001      Lumbar Exercises: Stretches   Active Hamstring Stretch 3 reps;20 seconds  seated     Lumbar Exercises: Seated   Sit to Stand Limitations flexion stretch rolling red ball 5x     Lumbar Exercises: Supine   Clam 10 reps   Clam Limitations green band   Bridge 10 reps      Knee/Hip Exercises: Aerobic   Nustep level 4x 10 minutes  PT present to discuss progress     Knee/Hip Exercises: Standing   Gait Training 5 laps 80 feet each;  1 lap with RW with close supervision   Other Standing Knee Exercises tandem stance hold 15 seconds 5x each side;      Knee/Hip Exercises: Supine   Other Supine Knee/Hip Exercises assisted lumbar rotation with LEs on green ball    Other Supine Knee/Hip Exercises assisted hip/knee flexion with LEs on green ball     Modalities   Modalities Moist Heat     Moist Heat Therapy   Number Minutes Moist Heat 15 Minutes   Moist Heat Location Lumbar Spine  during exercise     Manual Therapy   Passive ROM Bil gastroc stretch, HS stretch, hip rotators, hip flexors 3x 20 sec holds ;  bilateral trunk and hip rotation and SKTC                  PT Short Term Goals - 04/28/17 1623      PT SHORT TERM GOAL #1   Title Pt  able to stretch hamstrings up to 40 deg with leg straight due to improved muscle length   Status Achieved     PT SHORT TERM GOAL #2   Title TUG decreased to 14 sec or less due to improved strength and gait   Baseline 20.73   Time 4   Period Weeks   Status On-going     PT SHORT TERM GOAL #3   Title independent with initial HEP   Status Achieved           PT Long Term Goals - 04/24/17 1356      PT LONG TERM GOAL #1   Title pt demonstrates TUG < or = to 12 sec for reduced risk of falls due to improved gait mechanics   Time 12   Period Weeks   Status On-going     PT LONG TERM GOAL #2   Title Pt demonstrates 5 x sit to stand < or = to 40 sec without plopping down into his chair due to improved strength and functional movements   Time 12   Period Weeks   Status On-going     PT LONG TERM GOAL #3   Title pt reports 50% improved low back pain   Time 12   Period Weeks   Status On-going     PT LONG TERM GOAL #4   Title independent with advanced HEP   Time 12   Period Weeks   Status On-going                Plan - 04/28/17 1626    Clinical Impression Statement Pt reports that he has had many people tell him that he is walking better.  Pt with continued and chronic LBP that has increased over the past 2 weeks.  Pt with spasticity and limited A/ROM due to CP.  Pt will continue to benefit from skilled PT for hip flexibility, strength and gait.     Rehab Potential Excellent   PT Frequency 2x / week   PT Duration 12 weeks   PT Treatment/Interventions ADLs/Self Care Home Management;Biofeedback;Cryotherapy;Electrical Stimulation;Moist Heat;Ultrasound;Gait training;Stair training;Functional mobility training;Therapeutic activities;Therapeutic exercise;Balance training;Neuromuscular re-education;Patient/family education;Passive range of motion;Dry needling;Taping;Manual techniques   PT Next Visit Plan lumbar and hip ROM and stretches, LE strengthening, low level balance; gait training to build up endurance;  rolling walker options;  surgery 9/12   Consulted and Agree with Plan of Care Patient      Patient will benefit from skilled therapeutic intervention in order to improve the following deficits and impairments:  Abnormal gait, Decreased balance, Decreased coordination, Decreased range of motion, Impaired flexibility, Difficulty walking, Decreased strength, Pain, Impaired tone  Visit Diagnosis: Cramp and spasm  Other abnormalities of gait and mobility  Muscle weakness (generalized)     Problem List There are no active problems to display for this patient.    Lorrene ReidKelly Takacs, PT 04/28/17 4:49 PM  [facility] [address], [address] [place], [place], [address] Phone: [phone]   Fax:  [phone]  Name: Eddie Valentine MRN: [id number] Date of Birth: [birth year]

## 2017-04-30 ENCOUNTER — Ambulatory Visit: Payer: Managed Care, Other (non HMO)

## 2017-04-30 DIAGNOSIS — M6281 Muscle weakness (generalized): Secondary | ICD-10-CM

## 2017-04-30 DIAGNOSIS — R2689 Other abnormalities of gait and mobility: Secondary | ICD-10-CM

## 2017-04-30 DIAGNOSIS — R252 Cramp and spasm: Secondary | ICD-10-CM

## 2017-04-30 NOTE — Therapy (Signed)
[facility] [address], [address] [place], [place], [address] Phone: [phone]   Fax:  [phone]  Physical Therapy Treatment  Patient Details  Name: Eddie Valentine MRN: [id number] Date of Birth: 1990-12-23 Referring Provider: Jarrett Soho  Encounter Date: 04/30/2017      PT End of Session - 04/30/17 1627    Visit Number 11   Date for PT Re-Evaluation 06/10/17   Authorization - Visit Number 11   Authorization - Number of Visits 30   PT Start Time 1621   PT Stop Time 1704   PT Time Calculation (min) 43 min   Activity Tolerance Patient tolerated treatment well;Patient limited by pain   Behavior During Therapy [facility] for tasks assessed/performed      Past Medical History:  Diagnosis Date  . Cerebral palsy [facility])     Past Surgical History:  Procedure Laterality Date  . DEEP BRAIN STIMULATOR PLACEMENT      There were no vitals filed for this visit.      Subjective Assessment - 04/30/17 1625    Subjective Pt. anxious regarding surgery tomorrow.     Patient Stated Goals get walking better and get pain level down   Currently in Pain? Yes   Pain Score 8    Pain Location Back   Pain Orientation Lower   Pain Descriptors / Indicators Sore   Pain Type Chronic pain   Pain Radiating Towards radiates downs into lateral calfs   Pain Onset More than a month ago   Pain Frequency Intermittent   Aggravating Factors  walking,    Pain Relieving Factors massaging, heating    Multiple Pain Sites No                         [facility] Adult PT Treatment/Exercise - 04/30/17 1643      Lumbar Exercises: Supine   Clam 15 reps   Clam Limitations green band   Bridge 15 reps     Knee/Hip Exercises: Aerobic   Nustep level 4x 10 minutes  PTA present to discuss status     Knee/Hip Exercises: Seated   Other Seated Knee/Hip Exercises Seated 3-way lumbar stretch leaning out over p-ball x 10 sec each way     Knee/Hip Exercises: Supine   Bridges with Clamshell 15 reps;Both;Strengthening  green TB at knees      Manual Therapy   Passive ROM Bil gastroc stretch, HS stretch, hip rotators, hip flexors 3x 20 sec holds ;  bilateral trunk and hip rotation and SKTC                  PT Short Term Goals - 04/28/17 1623      PT SHORT TERM GOAL #1   Title Pt able to stretch hamstrings up to 40 deg with leg straight due to improved muscle length   Status Achieved     PT SHORT TERM GOAL #2   Title TUG decreased to 14 sec or less due to improved strength and gait   Baseline 20.73   Time 4   Period Weeks   Status On-going     PT SHORT TERM GOAL #3   Title independent with initial HEP   Status Achieved           PT Long Term Goals - 04/24/17 1356      PT LONG TERM GOAL #1   Title pt demonstrates TUG < or = to 12 sec for reduced  risk of falls due to improved gait mechanics   Time 12   Period Weeks   Status On-going     PT LONG TERM GOAL #2   Title Pt demonstrates 5 x sit to stand < or = to 40 sec without plopping down into his chair due to improved strength and functional movements   Time 12   Period Weeks   Status On-going     PT LONG TERM GOAL #3   Title pt reports 50% improved low back pain   Time 12   Period Weeks   Status On-going     PT LONG TERM GOAL #4   Title independent with advanced HEP   Time 12   Period Weeks   Status On-going               Plan - 04/30/17 1628    Clinical Impression Statement Pt. anxious today regarding upcoming surgery.  Tolerated all stretching and lumbopelvic strengthening activities well today without issue.  Mother present to end treatment noting she feels pt. is "walking better" with pt. aggreeing.     PT Treatment/Interventions ADLs/Self Care Home Management;Biofeedback;Cryotherapy;Electrical Stimulation;Moist Heat;Ultrasound;Gait training;Stair training;Functional mobility training;Therapeutic activities;Therapeutic  exercise;Balance training;Neuromuscular re-education;Patient/family education;Passive range of motion;Dry needling;Taping;Manual techniques   PT Next Visit Plan lumbar and hip ROM and stretches, LE strengthening, low level balance; gait training to build up endurance;  rolling walker options;  surgery 9/12      Patient will benefit from skilled therapeutic intervention in order to improve the following deficits and impairments:  Abnormal gait, Decreased balance, Decreased coordination, Decreased range of motion, Impaired flexibility, Difficulty walking, Decreased strength, Pain, Impaired tone  Visit Diagnosis: Cramp and spasm  Other abnormalities of gait and mobility  Muscle weakness (generalized)     Problem List There are no active problems to display for this patient.  Kermit BaloMicah Kevonte Vanecek, PTA 04/30/17 5:20 PM  [facility] [address], [address] [place], [place], [address] Phone: [phone]   Fax:  [phone]  Name: Eddie Valentine MRN: [id number] Date of Birth: 16-Jan-1991

## 2017-05-05 ENCOUNTER — Ambulatory Visit: Payer: Managed Care, Other (non HMO)

## 2017-05-05 DIAGNOSIS — R252 Cramp and spasm: Secondary | ICD-10-CM | POA: Diagnosis not present

## 2017-05-05 DIAGNOSIS — R2689 Other abnormalities of gait and mobility: Secondary | ICD-10-CM

## 2017-05-05 DIAGNOSIS — M6281 Muscle weakness (generalized): Secondary | ICD-10-CM

## 2017-05-05 NOTE — Therapy (Signed)
[facility] [address], [address] [place], [place], [address] Phone: [phone]   Fax:  [phone]  Physical Therapy Treatment  Patient Details  Name: Eddie Valentine MRN: [id number] Date of Birth: 1991/01/19 Referring Provider: Jarrett Soho  Encounter Date: 05/05/2017      PT End of Session - 05/05/17 1651    Visit Number 12   Date for PT Re-Evaluation 06/10/17   Authorization - Visit Number 12   Authorization - Number of Visits 30   PT Start Time 1614   PT Stop Time 1652   PT Time Calculation (min) 38 min   Activity Tolerance Patient tolerated treatment well   Behavior During Therapy [facility] for tasks assessed/performed      Past Medical History:  Diagnosis Date  . Cerebral palsy [facility])     Past Surgical History:  Procedure Laterality Date  . DEEP BRAIN STIMULATOR PLACEMENT      There were no vitals filed for this visit.      Subjective Assessment - 05/05/17 1622    Subjective I haven't been very active since I had surgery on Thursday.     Pertinent History cerebal palsy, dystonia   Currently in Pain? Yes   Pain Score 2    Pain Location Back   Pain Orientation Lower   Pain Descriptors / Indicators Sore   Pain Type Chronic pain   Pain Onset More than a month ago   Pain Frequency Intermittent   Aggravating Factors  walking   Pain Relieving Factors massaging, heating                         [facility] Adult PT Treatment/Exercise - 05/05/17 0001      Lumbar Exercises: Stretches   Lower Trunk Rotation 3 reps;20 seconds  legs on ball with overpressue by PT     Lumbar Exercises: Supine   Clam 15 reps   Clam Limitations green band   Bridge 15 reps     Knee/Hip Exercises: Aerobic   Nustep level 4x 10 minutes  PT present to discuss status     Knee/Hip Exercises: Standing   Gait Training 3 laps 80 feet each;  1 lap with RW with close supervision     Knee/Hip Exercises: Seated   Sit to  Sand 20 reps;without UE support  good control with stand to sit     Manual Therapy   Passive ROM Bil gastroc stretch, HS stretch, hip rotators, hip flexors 3x 20 sec holds ;  bilateral trunk and hip rotation and SKTC                  PT Short Term Goals - 04/28/17 1623      PT SHORT TERM GOAL #1   Title Pt able to stretch hamstrings up to 40 deg with leg straight due to improved muscle length   Status Achieved     PT SHORT TERM GOAL #2   Title TUG decreased to 14 sec or less due to improved strength and gait   Baseline 20.73   Time 4   Period Weeks   Status On-going     PT SHORT TERM GOAL #3   Title independent with initial HEP   Status Achieved           PT Long Term Goals - 05/05/17 1624      PT LONG TERM GOAL #2   Title Pt demonstrates 5 x sit to stand <  or = to 40 sec without plopping down into his chair due to improved strength and functional movements   Time 12   Period Weeks   Status On-going     PT LONG TERM GOAL #3   Title pt reports 50% improved low back pain   Baseline it depends   Time 12   Period Weeks   Status On-going               Plan - 05/05/17 1628    Clinical Impression Statement Pt has been less active due to surgery on Thursday.  Pt able to perform all exercise in the clinic without limitation.  Pt with improved gait with upright posture and improved control with sit to stand.  Pt will continue to benefit from skilled PT for flexibility, gait and LE control to improve gait.     Rehab Potential Excellent   PT Frequency 2x / week   PT Duration 12 weeks   PT Treatment/Interventions ADLs/Self Care Home Management;Biofeedback;Cryotherapy;Electrical Stimulation;Moist Heat;Ultrasound;Gait training;Stair training;Functional mobility training;Therapeutic activities;Therapeutic exercise;Balance training;Neuromuscular re-education;Patient/family education;Passive range of motion;Dry needling;Taping;Manual techniques   PT Next Visit Plan  lumbar and hip ROM and stretches, LE strengthening, low level balance; gait training to build up endurance   Consulted and Agree with Plan of Care Patient      Patient will benefit from skilled therapeutic intervention in order to improve the following deficits and impairments:  Abnormal gait, Decreased balance, Decreased coordination, Decreased range of motion, Impaired flexibility, Difficulty walking, Decreased strength, Pain, Impaired tone  Visit Diagnosis: Cramp and spasm  Other abnormalities of gait and mobility  Muscle weakness (generalized)     Problem List There are no active problems to display for this patient.   Lorrene Reid, PT 05/05/17 4:56 PM  [facility] [address] [place], [place], [address] Phone: [phone]   Fax:  [phone]  Name: Eddie Valentine MRN: [id number] Date of Birth: [birth year]

## 2017-05-07 ENCOUNTER — Ambulatory Visit: Payer: Managed Care, Other (non HMO)

## 2017-05-07 DIAGNOSIS — R252 Cramp and spasm: Secondary | ICD-10-CM

## 2017-05-07 DIAGNOSIS — M6281 Muscle weakness (generalized): Secondary | ICD-10-CM

## 2017-05-07 DIAGNOSIS — R2689 Other abnormalities of gait and mobility: Secondary | ICD-10-CM

## 2017-05-07 NOTE — Therapy (Signed)
[facility] [address], [address] [place], [place], [address] Phone: [phone]   Fax:  [phone]  Physical Therapy Treatment  Patient Details  Name: Eddie Valentine MRN: [id number] Date of Birth: 07-13-1991 Referring Provider: Jarrett Soho  Encounter Date: 05/07/2017      PT End of Session - 05/07/17 1635    Visit Number 13   Date for PT Re-Evaluation 06/10/17   Authorization - Visit Number 13   Authorization - Number of Visits 30   PT Start Time 1555   PT Stop Time 1638   PT Time Calculation (min) 43 min   Activity Tolerance Patient tolerated treatment well   Behavior During Therapy [facility] for tasks assessed/performed      Past Medical History:  Diagnosis Date  . Cerebral palsy [facility])     Past Surgical History:  Procedure Laterality Date  . DEEP BRAIN STIMULATOR PLACEMENT      There were no vitals filed for this visit.      Subjective Assessment - 05/07/17 1605    Subjective I am really getting better.  I can walk straighter now.     Currently in Pain? Yes   Pain Location Back   Pain Orientation Lower   Pain Descriptors / Indicators Sore;Tightness   Pain Type Chronic pain   Pain Onset More than a month ago   Pain Frequency Intermittent   Aggravating Factors  walking, daily activity   Pain Relieving Factors heat, stretching                         [facility] Adult PT Treatment/Exercise - 05/07/17 0001      Lumbar Exercises: Stretches   Lower Trunk Rotation 3 reps;20 seconds  legs on ball with overpressue by PT     Lumbar Exercises: Supine   Clam 15 reps   Clam Limitations green band   Bridge 15 reps     Knee/Hip Exercises: Aerobic   Nustep level 4x 15 minutes  PT present to discuss status     Knee/Hip Exercises: Standing   Gait Training 3 laps 80 feet each;  1 lap with RW with close supervision     Knee/Hip Exercises: Seated   Sit to Sand 20 reps;without UE support  good  control with stand to sit     Knee/Hip Exercises: Supine   Bridges with Clamshell 15 reps;Both;Strengthening     Manual Therapy   Passive ROM Bil gastroc stretch, HS stretch, hip rotators, hip flexors 3x 20 sec holds ;  bilateral trunk and hip rotation and SKTC                  PT Short Term Goals - 04/28/17 1623      PT SHORT TERM GOAL #1   Title Pt able to stretch hamstrings up to 40 deg with leg straight due to improved muscle length   Status Achieved     PT SHORT TERM GOAL #2   Title TUG decreased to 14 sec or less due to improved strength and gait   Baseline 20.73   Time 4   Period Weeks   Status On-going     PT SHORT TERM GOAL #3   Title independent with initial HEP   Status Achieved           PT Long Term Goals - 05/05/17 1624      PT LONG TERM GOAL #2   Title Pt demonstrates 5 x  sit to stand < or = to 40 sec without plopping down into his chair due to improved strength and functional movements   Time 12   Period Weeks   Status On-going     PT LONG TERM GOAL #3   Title pt reports 50% improved low back pain   Baseline it depends   Time 12   Period Weeks   Status On-going               Plan - 05/07/17 1641    Clinical Impression Statement Pt with improved gait and upright posture today.  Pt with improved quad control with sit to stand transition.  Pt with spasticity in LEs and P/ROM is improving with manual therapy.  Pt will continue to benefit from skilled PT for LE strength, functional mobility and flexibility.     Rehab Potential Excellent   PT Frequency 2x / week   PT Duration 12 weeks   PT Treatment/Interventions ADLs/Self Care Home Management;Biofeedback;Cryotherapy;Electrical Stimulation;Moist Heat;Ultrasound;Gait training;Stair training;Functional mobility training;Therapeutic activities;Therapeutic exercise;Balance training;Neuromuscular re-education;Patient/family education;Passive range of motion;Dry needling;Taping;Manual  techniques   PT Next Visit Plan lumbar and hip ROM and stretches, LE strengthening, low level balance; gait training to build up endurance   Consulted and Agree with Plan of Care Patient      Patient will benefit from skilled therapeutic intervention in order to improve the following deficits and impairments:  Abnormal gait, Decreased balance, Decreased coordination, Decreased range of motion, Impaired flexibility, Difficulty walking, Decreased strength, Pain, Impaired tone  Visit Diagnosis: Cramp and spasm  Other abnormalities of gait and mobility  Muscle weakness (generalized)     Problem List There are no active problems to display for this patient.   Lorrene Reid, PT 05/07/17 4:43 PM  [facility] [address] [place], [place], [address] Phone: [phone]   Fax:  [phone]  Name: Ember Gottwald MRN: [id number] Date of Birth: 02-05-1991

## 2017-05-12 ENCOUNTER — Encounter: Payer: Self-pay | Admitting: Physical Therapy

## 2017-05-12 ENCOUNTER — Ambulatory Visit: Payer: Managed Care, Other (non HMO) | Admitting: Physical Therapy

## 2017-05-12 DIAGNOSIS — R252 Cramp and spasm: Secondary | ICD-10-CM

## 2017-05-12 DIAGNOSIS — R2689 Other abnormalities of gait and mobility: Secondary | ICD-10-CM

## 2017-05-12 DIAGNOSIS — M6281 Muscle weakness (generalized): Secondary | ICD-10-CM

## 2017-05-12 NOTE — Therapy (Signed)
[facility] [address] [place], [place], [address] Phone: [phone]   Fax:  [phone]  Physical Therapy Treatment  Patient Details  Name: Eddie Valentine MRN: [id number] Date of Birth: June 21, 1991 Referring Provider: Jarrett Soho  Encounter Date: 05/12/2017      PT End of Session - 05/12/17 1608    Visit Number 14   Date for PT Re-Evaluation 06/10/17   Authorization - Visit Number 14   Authorization - Number of Visits 30   PT Start Time 1605   PT Stop Time 1659  moist heat for last 8 min   PT Time Calculation (min) 54 min   Activity Tolerance Patient tolerated treatment well   Behavior During Therapy [facility] for tasks assessed/performed      Past Medical History:  Diagnosis Date  . Cerebral palsy [facility])     Past Surgical History:  Procedure Laterality Date  . DEEP BRAIN STIMULATOR PLACEMENT      There were no vitals filed for this visit.      Subjective Assessment - 05/12/17 1610    Subjective I am feeling terrible today.  I have had a bad weekend.   Pertinent History cerebal palsy, dystonia   Limitations Walking   How long can you walk comfortably? a couple times down and back in hall   Patient Stated Goals get walking better and get pain level down   Currently in Pain? Yes   Pain Score 8    Pain Location Back   Pain Orientation Lower   Pain Descriptors / Indicators Sore;Tightness   Pain Type Chronic pain   Pain Onset More than a month ago   Pain Frequency Intermittent   Aggravating Factors  walking, sitting   Pain Relieving Factors stretching   Multiple Pain Sites No                         [facility] Adult PT Treatment/Exercise - 05/12/17 0001      Lumbar Exercises: Stretches   Lower Trunk Rotation 5 reps  PT min assist to neutral     Lumbar Exercises: Supine   Clam 20 reps   Clam Limitations green band   Bridge 15 reps     Knee/Hip Exercises: Aerobic   Nustep  level 4x 15 minutes  PT present to discuss status     Knee/Hip Exercises: Standing   Gait Training 3 laps 80 feet each;  1 lap with RW with close supervision     Knee/Hip Exercises: Seated   Sit to Sand without UE support;3 sets;10 reps  good control with stand to sit     Knee/Hip Exercises: Supine   Bridges with Clamshell --     Manual Therapy   Passive ROM Bil gastroc stretch, HS stretch, hip rotators, hip flexors 3x 20 sec holds ;  bilateral trunk and hip rotation and SKTC                  PT Short Term Goals - 04/28/17 1623      PT SHORT TERM GOAL #1   Title Pt able to stretch hamstrings up to 40 deg with leg straight due to improved muscle length   Status Achieved     PT SHORT TERM GOAL #2   Title TUG decreased to 14 sec or less due to improved strength and gait   Baseline 20.73   Time 4   Period Weeks   Status On-going  PT SHORT TERM GOAL #3   Title independent with initial HEP   Status Achieved           PT Long Term Goals - 05/12/17 1658      PT LONG TERM GOAL #1   Title pt demonstrates TUG < or = to 12 sec for reduced risk of falls due to improved gait mechanics   Time 12   Period Weeks   Status On-going     PT LONG TERM GOAL #2   Title Pt demonstrates 5 x sit to stand < or = to 40 sec without plopping down into his chair due to improved strength and functional movements   Time 12   Period Weeks   Status On-going     PT LONG TERM GOAL #3   Title pt reports 50% improved low back pain   Time 12   Period Weeks   Status On-going     PT LONG TERM GOAL #4   Title independent with advanced HEP   Time 12   Period Weeks   Status On-going               Plan - 05/12/17 1609    Clinical Impression Statement Pt was able to increase reps for all exercises today.  He demonstrates improved control when sitting and able to keep knees from having as much medial collapse with verbal cues . Pt will benefit from skilled PT for improved LE  function and gait.   Rehab Potential Excellent   PT Treatment/Interventions ADLs/Self Care Home Management;Biofeedback;Cryotherapy;Electrical Stimulation;Moist Heat;Ultrasound;Gait training;Stair training;Functional mobility training;Therapeutic activities;Therapeutic exercise;Balance training;Neuromuscular re-education;Patient/family education;Passive range of motion;Dry needling;Taping;Manual techniques   PT Next Visit Plan lumbar and hip ROM and stretches, LE strengthening, low level balance; gait training to build up endurance   PT Home Exercise Plan progress as needed   Consulted and Agree with Plan of Care Patient      Patient will benefit from skilled therapeutic intervention in order to improve the following deficits and impairments:  Abnormal gait, Decreased balance, Decreased coordination, Decreased range of motion, Impaired flexibility, Difficulty walking, Decreased strength, Pain, Impaired tone  Visit Diagnosis: Cramp and spasm  Other abnormalities of gait and mobility  Muscle weakness (generalized)     Problem List There are no active problems to display for this patient.   Vincente Poli, PT 05/12/2017, 4:59 PM  [facility] [address] [place], [place], [address] Phone: [phone]   Fax:  [phone]  Name: Eddie Valentine MRN: [id number] Date of Birth: 01-Jul-1991

## 2017-05-14 ENCOUNTER — Ambulatory Visit: Payer: Managed Care, Other (non HMO) | Admitting: Physical Therapy

## 2017-05-14 DIAGNOSIS — M6281 Muscle weakness (generalized): Secondary | ICD-10-CM

## 2017-05-14 DIAGNOSIS — R252 Cramp and spasm: Secondary | ICD-10-CM

## 2017-05-14 DIAGNOSIS — R2689 Other abnormalities of gait and mobility: Secondary | ICD-10-CM

## 2017-05-14 NOTE — Therapy (Signed)
[facility] [address], [address] [place], [place], [address] Phone: [phone]   Fax:  [phone]  Physical Therapy Treatment  Patient Details  Name: Eddie Valentine MRN: [id number] Date of Birth: 1991/05/06 Referring Provider: Jarrett Soho  Encounter Date: 05/14/2017      PT End of Session - 05/14/17 1305    Visit Number 15   Date for PT Re-Evaluation 06/10/17   Authorization - Visit Number 15   Authorization - Number of Visits 30   PT Start Time 1225   PT Stop Time 1308   PT Time Calculation (min) 43 min   Activity Tolerance Patient tolerated treatment well   Behavior During Therapy [facility] for tasks assessed/performed      Past Medical History:  Diagnosis Date  . Cerebral palsy [facility])     Past Surgical History:  Procedure Laterality Date  . DEEP BRAIN STIMULATOR PLACEMENT      There were no vitals filed for this visit.      Subjective Assessment - 05/14/17 1313    Subjective I am feeling sore today from working hard on Monday and walking around [employer] yesterday   Pertinent History cerebal palsy, dystonia   Limitations Walking   How long can you walk comfortably? a couple times down and back in hall   Patient Stated Goals get walking better and get pain level down   Currently in Pain? Yes   Pain Score 8    Pain Location Back   Pain Orientation Lower   Pain Descriptors / Indicators Sore   Pain Type Chronic pain   Pain Onset More than a month ago   Pain Frequency Intermittent                         [facility] Adult PT Treatment/Exercise - 05/14/17 0001      Lumbar Exercises: Stretches   Lower Trunk Rotation 5 reps     Lumbar Exercises: Supine   Clam 20 reps   Clam Limitations green band   Bridge 15 reps     Knee/Hip Exercises: Aerobic   Nustep level 1x 10 minutes  PT present to discuss status; moist heat during due to pain     Knee/Hip Exercises: Seated   Sit to Sand  without UE support;10 reps;2 sets  good control with stand to sit     Manual Therapy   Passive ROM Bil gastroc stretch, HS stretch, hip rotators, hip flexors 3x 20 sec holds ;  bilateral trunk and hip rotation and SKTC                  PT Short Term Goals - 04/28/17 1623      PT SHORT TERM GOAL #1   Title Pt able to stretch hamstrings up to 40 deg with leg straight due to improved muscle length   Status Achieved     PT SHORT TERM GOAL #2   Title TUG decreased to 14 sec or less due to improved strength and gait   Baseline 20.73   Time 4   Period Weeks   Status On-going     PT SHORT TERM GOAL #3   Title independent with initial HEP   Status Achieved           PT Long Term Goals - 05/12/17 1658      PT LONG TERM GOAL #1   Title pt demonstrates TUG < or = to 12 sec for  reduced risk of falls due to improved gait mechanics   Time 12   Period Weeks   Status On-going     PT LONG TERM GOAL #2   Title Pt demonstrates 5 x sit to stand < or = to 40 sec without plopping down into his chair due to improved strength and functional movements   Time 12   Period Weeks   Status On-going     PT LONG TERM GOAL #3   Title pt reports 50% improved low back pain   Time 12   Period Weeks   Status On-going     PT LONG TERM GOAL #4   Title independent with advanced HEP   Time 12   Period Weeks   Status On-going               Plan - 05/14/17 1306    Clinical Impression Statement Patient was more sore today.  Used moist heat during nustep and during PROM stretches which helped give patient relief.  Pt needed more time for sit to stand between reps today.  He continues to benefit from skilled PT for improved ROM and functional movements.   Rehab Potential Excellent   PT Treatment/Interventions ADLs/Self Care Home Management;Biofeedback;Cryotherapy;Electrical Stimulation;Moist Heat;Ultrasound;Gait training;Stair training;Functional mobility training;Therapeutic  activities;Therapeutic exercise;Balance training;Neuromuscular re-education;Patient/family education;Passive range of motion;Dry needling;Taping;Manual techniques   PT Next Visit Plan lumbar and hip ROM and stretches, LE strengthening, low level balance; gait training to build up endurance   PT Home Exercise Plan progress as needed   Consulted and Agree with Plan of Care Patient      Patient will benefit from skilled therapeutic intervention in order to improve the following deficits and impairments:  Abnormal gait, Decreased balance, Decreased coordination, Decreased range of motion, Impaired flexibility, Difficulty walking, Decreased strength, Pain, Impaired tone  Visit Diagnosis: Cramp and spasm  Other abnormalities of gait and mobility  Muscle weakness (generalized)     Problem List There are no active problems to display for this patient.   Vincente Poli, PT 05/14/2017, 1:15 PM  [facility] [address] [place], [place], [address] Phone: [phone]   Fax:  [phone]  Name: Eddie Valentine MRN: [id number] Date of Birth: 08/10/1991

## 2017-05-22 ENCOUNTER — Ambulatory Visit: Payer: Managed Care, Other (non HMO) | Attending: Family Medicine | Admitting: Physical Therapy

## 2017-05-22 ENCOUNTER — Encounter: Payer: Self-pay | Admitting: Physical Therapy

## 2017-05-22 DIAGNOSIS — R2689 Other abnormalities of gait and mobility: Secondary | ICD-10-CM | POA: Diagnosis present

## 2017-05-22 DIAGNOSIS — M6281 Muscle weakness (generalized): Secondary | ICD-10-CM | POA: Insufficient documentation

## 2017-05-22 DIAGNOSIS — R252 Cramp and spasm: Secondary | ICD-10-CM | POA: Insufficient documentation

## 2017-05-22 NOTE — Therapy (Signed)
[facility] [address], [address] [place], [place], [address] Phone: [phone]   Fax:  [phone]  Physical Therapy Treatment  Patient Details  Name: Eddie Valentine MRN: [id number] Date of Birth: Sep 08, 1990 Referring Provider: Jarrett Soho  Encounter Date: 05/22/2017      PT End of Session - 05/22/17 1659    Visit Number 16   Date for PT Re-Evaluation 06/10/17   Authorization - Visit Number 16   Authorization - Number of Visits 30   PT Start Time 1610   PT Stop Time 1700   PT Time Calculation (min) 50 min   Activity Tolerance Patient tolerated treatment well   Behavior During Therapy [facility] for tasks assessed/performed      Past Medical History:  Diagnosis Date  . Cerebral palsy [facility])     Past Surgical History:  Procedure Laterality Date  . DEEP BRAIN STIMULATOR PLACEMENT      There were no vitals filed for this visit.      Subjective Assessment - 05/22/17 1709    Subjective I am feeling better today than I did last time I was here.  I didn't have time to do my stretches when I was with my family but I did a lot of walking   Pertinent History cerebal palsy, dystonia   Limitations Walking   Patient Stated Goals get walking better and get pain level down   Currently in Pain? Yes   Pain Score --  did not obtain a number   Pain Location Back   Pain Orientation Lower   Pain Descriptors / Indicators Aching   Pain Type Chronic pain   Pain Frequency Intermittent   Aggravating Factors  walking, sitting   Pain Relieving Factors stretches   Multiple Pain Sites No                         [facility] Adult PT Treatment/Exercise - 05/22/17 0001      Lumbar Exercises: Stretches   Lower Trunk Rotation 5 reps  rotation on red ball with PT assist     Lumbar Exercises: Supine   Clam 20 reps   Clam Limitations green band   Bridge 15 reps     Knee/Hip Exercises: Aerobic   Nustep level 1x 10  minutes, L4 x 5 min  PT present to discuss status; moist heat during due to pain     Knee/Hip Exercises: Seated   Sit to Sand without UE support;10 reps;2 sets  good control with stand to sit     Moist Heat Therapy   Number Minutes Moist Heat 20 Minutes  heat during exercises     Manual Therapy   Passive ROM Bil gastroc stretch, HS stretch, hip rotators, hip flexors 3x 20 sec holds ;  bilateral trunk and hip rotation and SKTC                  PT Short Term Goals - 04/28/17 1623      PT SHORT TERM GOAL #1   Title Pt able to stretch hamstrings up to 40 deg with leg straight due to improved muscle length   Status Achieved     PT SHORT TERM GOAL #2   Title TUG decreased to 14 sec or less due to improved strength and gait   Baseline 20.73   Time 4   Period Weeks   Status On-going     PT SHORT TERM GOAL #3  Title independent with initial HEP   Status Achieved           PT Long Term Goals - 05/22/17 1708      PT LONG TERM GOAL #1   Title pt demonstrates TUG < or = to 12 sec for reduced risk of falls due to improved gait mechanics   Time 12   Period Weeks   Status On-going     PT LONG TERM GOAL #2   Title Pt demonstrates 5 x sit to stand < or = to 40 sec without plopping down into his chair due to improved strength and functional movements   Time 12   Period Weeks   Status On-going     PT LONG TERM GOAL #3   Title pt reports 50% improved low back pain   Time 12   Period Weeks   Status On-going     PT LONG TERM GOAL #4   Title independent with advanced HEP   Time 12   Period Weeks   Status On-going               Plan - 05/22/17 1700    Clinical Impression Statement Patient responded well to manual therapy PROM with increased knee.  Patient was able to tolerate more exercises today that previous session.  He continues to demonstrate progress slowly due to CP and muscle spasms  He is expected to make further progress and due to comorbidities  needs skilled therapy to prevent regression of his condition.    Rehab Potential Excellent   Clinical Impairments Affecting Rehab Potential CP   PT Treatment/Interventions ADLs/Self Care Home Management;Biofeedback;Cryotherapy;Electrical Stimulation;Moist Heat;Ultrasound;Gait training;Stair training;Functional mobility training;Therapeutic activities;Therapeutic exercise;Balance training;Neuromuscular re-education;Patient/family education;Passive range of motion;Dry needling;Taping;Manual techniques   PT Next Visit Plan lumbar and hip ROM and stretches, LE strengthening, low level balance; gait training to build up endurance   PT Home Exercise Plan progress as needed   Consulted and Agree with Plan of Care Patient      Patient will benefit from skilled therapeutic intervention in order to improve the following deficits and impairments:  Abnormal gait, Decreased balance, Decreased coordination, Decreased range of motion, Impaired flexibility, Difficulty walking, Decreased strength, Pain, Impaired tone  Visit Diagnosis: Cramp and spasm  Other abnormalities of gait and mobility  Muscle weakness (generalized)     Problem List There are no active problems to display for this patient.   Vincente Poli, PT 05/22/2017, 5:11 PM  [facility] [address], [address] [place], [place], [address] Phone: [phone]   Fax:  [phone]  Name: Eddie Valentine MRN: [id number] Date of Birth: [birth year]

## 2017-05-26 ENCOUNTER — Ambulatory Visit: Payer: Managed Care, Other (non HMO)

## 2017-05-26 DIAGNOSIS — R2689 Other abnormalities of gait and mobility: Secondary | ICD-10-CM

## 2017-05-26 DIAGNOSIS — R252 Cramp and spasm: Secondary | ICD-10-CM | POA: Diagnosis not present

## 2017-05-26 DIAGNOSIS — M6281 Muscle weakness (generalized): Secondary | ICD-10-CM

## 2017-05-26 NOTE — Therapy (Signed)
[facility] [address], [address] [place], [place], [address] Phone: [phone]   Fax:  [phone]  Physical Therapy Treatment  Patient Details  Name: Cristino Degroff MRN: [id number] Date of Birth: Jan 10, 1991 Referring Provider: Jarrett Soho  Encounter Date: 05/26/2017      PT End of Session - 05/26/17 1644    Visit Number 17   Date for PT Re-Evaluation 06/10/17   Authorization - Visit Number 17   Authorization - Number of Visits 30   PT Start Time 1557   PT Stop Time 1644   PT Time Calculation (min) 47 min   Activity Tolerance Patient tolerated treatment well   Behavior During Therapy [facility] for tasks assessed/performed      Past Medical History:  Diagnosis Date  . Cerebral palsy [facility])     Past Surgical History:  Procedure Laterality Date  . DEEP BRAIN STIMULATOR PLACEMENT      There were no vitals filed for this visit.      Subjective Assessment - 05/26/17 1607    Subjective Pt had a fall on Saturday at [facility] in the bathroom.  Pt has shoulder looked at by MD.  No fracture.     Currently in Pain? Yes   Pain Score 6    Pain Location Back   Pain Orientation Lower   Pain Descriptors / Indicators Aching   Pain Type Chronic pain   Pain Onset More than a month ago   Pain Frequency Intermittent   Aggravating Factors  walking, sitting   Pain Relieving Factors stretches, heat            [facility] PT Assessment - 05/26/17 0001      Transfers   Five time sit to stand comments  29 seconds                     [facility] Adult PT Treatment/Exercise - 05/26/17 0001      Ambulation/Gait   Gait Comments walking with upright posture and verbal cues for technique: 3 laps x 2 (1 minute 45 seconds- improved from last time 2 minutes, 25 seconds)     Lumbar Exercises: Stretches   Lower Trunk Rotation 10 seconds  10 reps to each side. Rotation on red ball with PT assist     Lumbar Exercises: Supine   Clam 20 reps   Clam Limitations green band   Bridge 15 reps     Knee/Hip Exercises: Aerobic   Nustep level 1x 10 minutes  PT present to discuss status     Knee/Hip Exercises: Seated   Sit to Sand without UE support;10 reps;2 sets  good control with stand to sit     Moist Heat Therapy   Number Minutes Moist Heat 20 Minutes  during supine exercise   Moist Heat Location Lumbar Spine     Manual Therapy   Passive ROM Bil gastroc stretch, HS stretch, hip rotators, hip flexors 3x 20 sec holds ;  bilateral trunk and hip rotation and SKTC                  PT Short Term Goals - 05/26/17 1610      PT SHORT TERM GOAL #2   Title TUG decreased to 14 sec or less due to improved strength and gait   Time 4   Period Weeks   Status On-going     PT SHORT TERM GOAL #3   Title independent with initial HEP   Status  Achieved           PT Long Term Goals - 05/26/17 1623      PT LONG TERM GOAL #2   Title Pt demonstrates 5 x sit to stand < or = to 40 sec without plopping down into his chair due to improved strength and functional movements   Baseline 29.44  seconds   Time 12   Period Weeks   Status On-going     PT LONG TERM GOAL #3   Title pt reports 50% improved low back pain   Baseline it varies   Time 12   Period Weeks   Status On-going     PT LONG TERM GOAL #4   Title independent with advanced HEP   Period Weeks   Status On-going               Plan - 05/26/17 1632    Clinical Impression Statement Pt with improved control with sit to stand and has fewer episodes of decreased control with sitting.  Pt able to perform 5x sit to stand in 29.44 seconds.  Pt with improved flexibility in LEs with P/ROM today.  Pt with improved control and level position with bridging.  Pt will continue to benefit from skilled PT for LE strength, flexbility and to improve functional mobility.     Rehab Potential Excellent   PT Frequency 2x / week   PT Duration 12 weeks   PT  Treatment/Interventions ADLs/Self Care Home Management;Biofeedback;Cryotherapy;Electrical Stimulation;Moist Heat;Ultrasound;Gait training;Stair training;Functional mobility training;Therapeutic activities;Therapeutic exercise;Balance training;Neuromuscular re-education;Patient/family education;Passive range of motion;Dry needling;Taping;Manual techniques   PT Next Visit Plan lumbar and hip ROM and stretches, LE strengthening, low level balance; gait training to build up endurance   Consulted and Agree with Plan of Care Patient      Patient will benefit from skilled therapeutic intervention in order to improve the following deficits and impairments:  Abnormal gait, Decreased balance, Decreased coordination, Decreased range of motion, Impaired flexibility, Difficulty walking, Decreased strength, Pain, Impaired tone  Visit Diagnosis: Other abnormalities of gait and mobility  Muscle weakness (generalized)  Cramp and spasm     Problem List There are no active problems to display for this patient.  Lorrene Reid, PT 05/26/17 4:46 PM   [facility] [address] [place], [place], [address] Phone: [phone]   Fax:  [phone]  Name: Keefe Zawistowski MRN: [id number] Date of Birth: 29-Mar-1991

## 2017-05-28 ENCOUNTER — Ambulatory Visit: Payer: Managed Care, Other (non HMO) | Admitting: Physical Therapy

## 2017-05-28 ENCOUNTER — Encounter: Payer: Self-pay | Admitting: Physical Therapy

## 2017-05-28 DIAGNOSIS — R252 Cramp and spasm: Secondary | ICD-10-CM | POA: Diagnosis not present

## 2017-05-28 DIAGNOSIS — R2689 Other abnormalities of gait and mobility: Secondary | ICD-10-CM

## 2017-05-28 DIAGNOSIS — M6281 Muscle weakness (generalized): Secondary | ICD-10-CM

## 2017-05-28 NOTE — Therapy (Signed)
[facility] [address] [place], [place], [address] Phone: [phone]   Fax:  [phone]  Physical Therapy Treatment  Patient Details  Name: Eddie Valentine MRN: [id number] Date of Birth: Jan 07, 1991 Referring Provider: Jarrett Soho  Encounter Date: 05/28/2017      PT End of Session - 05/28/17 1631    Visit Number 18   Date for PT Re-Evaluation 06/10/17   Authorization - Visit Number 18   Authorization - Number of Visits 30   PT Start Time 1609   PT Stop Time 1650   PT Time Calculation (min) 41 min   Activity Tolerance Patient tolerated treatment well   Behavior During Therapy [facility] for tasks assessed/performed      Past Medical History:  Diagnosis Date  . Cerebral palsy [facility])     Past Surgical History:  Procedure Laterality Date  . DEEP BRAIN STIMULATOR PLACEMENT      There were no vitals filed for this visit.      Subjective Assessment - 05/28/17 1623    Subjective I feel better today.  Back to normal back pain levels.   Currently in Pain? Yes   Pain Score 6    Pain Location Back   Pain Orientation Lower   Pain Descriptors / Indicators Aching   Multiple Pain Sites No                         [facility] Adult PT Treatment/Exercise - 05/28/17 0001      Lumbar Exercises: Stretches   Lower Trunk Rotation 10 seconds  10 reps to each side.     Lumbar Exercises: Supine   Clam 20 reps   Clam Limitations green band   Bridge --  3x5, TC to stabillize hips/ankles     Knee/Hip Exercises: Standing   Other Standing Knee Exercises 2 laps of gym post Nustep: light CGA, pt appeared more unstable from fatigue.     Knee/Hip Exercises: Seated   Sit to Sand without UE support;10 reps;2 sets  good control with stand to sit     Moist Heat Therapy   Number Minutes Moist Heat --  On back during Nustep     Manual Therapy   Passive ROM Bil gastroc stretch, HS stretch, hip rotators, hip  flexors 3x 20 sec holds ;  bilateral trunk and hip rotation and SKTC                  PT Short Term Goals - 05/26/17 1610      PT SHORT TERM GOAL #2   Title TUG decreased to 14 sec or less due to improved strength and gait   Time 4   Period Weeks   Status On-going     PT SHORT TERM GOAL #3   Title independent with initial HEP   Status Achieved           PT Long Term Goals - 05/26/17 1623      PT LONG TERM GOAL #2   Title Pt demonstrates 5 x sit to stand < or = to 40 sec without plopping down into his chair due to improved strength and functional movements   Baseline 29.44  seconds   Time 12   Period Weeks   Status On-going     PT LONG TERM GOAL #3   Title pt reports 50% improved low back pain   Baseline it varies   Time 12   Period  Weeks   Status On-going     PT LONG TERM GOAL #4   Title independent with advanced HEP   Period Weeks   Status On-going               Plan - 05/28/17 1634    Rehab Potential Excellent   Clinical Impairments Affecting Rehab Potential CP   PT Frequency 2x / week   PT Duration 12 weeks   PT Treatment/Interventions ADLs/Self Care Home Management;Biofeedback;Cryotherapy;Electrical Stimulation;Moist Heat;Ultrasound;Gait training;Stair training;Functional mobility training;Therapeutic activities;Therapeutic exercise;Balance training;Neuromuscular re-education;Patient/family education;Passive range of motion;Dry needling;Taping;Manual techniques   PT Next Visit Plan lumbar and hip ROM and stretches, LE strengthening, low level balance; gait training to build up endurance   Consulted and Agree with Plan of Care Patient      Patient will benefit from skilled therapeutic intervention in order to improve the following deficits and impairments:  Abnormal gait, Decreased balance, Decreased coordination, Decreased range of motion, Impaired flexibility, Difficulty walking, Decreased strength, Pain, Impaired tone  Visit  Diagnosis: Other abnormalities of gait and mobility  Muscle weakness (generalized)  Cramp and spasm     Problem List There are no active problems to display for this patient.   Jameer Storie, PTA 05/28/2017, 4:57 PM  [facility] [address] [place], [place], [address] Phone: [phone]   Fax:  [phone]  Name: Eddie Valentine MRN: [id number] Date of Birth: 1990/10/10

## 2017-06-02 ENCOUNTER — Ambulatory Visit: Payer: Managed Care, Other (non HMO)

## 2017-06-02 DIAGNOSIS — R252 Cramp and spasm: Secondary | ICD-10-CM

## 2017-06-02 DIAGNOSIS — M6281 Muscle weakness (generalized): Secondary | ICD-10-CM

## 2017-06-02 DIAGNOSIS — R2689 Other abnormalities of gait and mobility: Secondary | ICD-10-CM

## 2017-06-02 NOTE — Therapy (Signed)
[facility] [address], [address] [place], [place], [address] Phone: [phone]   Fax:  [phone]  Physical Therapy Treatment  Patient Details  Name: Tlaloc Taddei MRN: [id number] Date of Birth: 1990-12-11 Referring Provider: Jarrett Soho  Encounter Date: 06/02/2017      PT End of Session - 06/02/17 1640    Visit Number 19   Date for PT Re-Evaluation 06/10/17   PT Start Time 1556   PT Stop Time 1641   PT Time Calculation (min) 45 min   Activity Tolerance Patient tolerated treatment well   Behavior During Therapy [facility] for tasks assessed/performed      Past Medical History:  Diagnosis Date  . Cerebral palsy [facility])     Past Surgical History:  Procedure Laterality Date  . DEEP BRAIN STIMULATOR PLACEMENT      There were no vitals filed for this visit.      Subjective Assessment - 06/02/17 1559    Subjective I didn't feel great earlier today.  I am feeling better now.     Currently in Pain? Yes   Pain Score 7    Pain Location Back   Pain Orientation Lower   Pain Descriptors / Indicators Aching   Pain Type Chronic pain   Pain Onset More than a month ago   Pain Frequency Intermittent   Aggravating Factors  walking, sitting, "it just hurts"   Pain Relieving Factors stretches, heat                         [facility] Adult PT Treatment/Exercise - 06/02/17 0001      Ambulation/Gait   Gait Comments walking with upright posture and verbal cues for technique: 4 laps (instability with fatigue on last lap), 3 laps on 2nd attempt.     Lumbar Exercises: Stretches   Lower Trunk Rotation 10 seconds  10 reps to each side.     Lumbar Exercises: Supine   Clam 20 reps   Clam Limitations green band   Bridge --  3x5, TC to stabillize hips/ankles     Knee/Hip Exercises: Aerobic   Nustep level 1x 10 minutes  PT present to discuss status     Knee/Hip Exercises: Seated   Sit to Sand without UE  support;10 reps;2 sets  good control with stand to sit     Moist Heat Therapy   Number Minutes Moist Heat 20 Minutes   Moist Heat Location Lumbar Spine     Manual Therapy   Passive ROM Bil gastroc stretch, HS stretch, hip rotators, hip flexors 3x 20 sec holds ;  bilateral trunk and hip rotation and SKTC                  PT Short Term Goals - 05/26/17 1610      PT SHORT TERM GOAL #2   Title TUG decreased to 14 sec or less due to improved strength and gait   Time 4   Period Weeks   Status On-going     PT SHORT TERM GOAL #3   Title independent with initial HEP   Status Achieved           PT Long Term Goals - 05/26/17 1623      PT LONG TERM GOAL #2   Title Pt demonstrates 5 x sit to stand < or = to 40 sec without plopping down into his chair due to improved strength and functional movements   Baseline  29.44  seconds   Time 12   Period Weeks   Status On-going     PT LONG TERM GOAL #3   Title pt reports 50% improved low back pain   Baseline it varies   Time 12   Period Weeks   Status On-going     PT LONG TERM GOAL #4   Title independent with advanced HEP   Period Weeks   Status On-going               Plan - 06/02/17 1601    Clinical Impression Statement Pt with improved control with sit to stand and has fewer episodes of decreased control with sitting.  Pt with significant improvement in LE flexibility with P/ROM today.  Pt also with improved muscle control with exercises today.  Pt with improved posture with gait and standing as well.  Pt will continue to benefit from skilled PT for LE strength, gait, core strength and LE flexibility, functional mobility.    Rehab Potential Excellent   PT Frequency 2x / week   PT Duration 12 weeks   PT Treatment/Interventions ADLs/Self Care Home Management;Biofeedback;Cryotherapy;Electrical Stimulation;Moist Heat;Ultrasound;Gait training;Stair training;Functional mobility training;Therapeutic activities;Therapeutic  exercise;Balance training;Neuromuscular re-education;Patient/family education;Passive range of motion;Dry needling;Taping;Manual techniques   PT Next Visit Plan lumbar and hip ROM and stretches, LE strengthening, low level balance; gait training to build up endurance   Consulted and Agree with Plan of Care Patient      Patient will benefit from skilled therapeutic intervention in order to improve the following deficits and impairments:  Abnormal gait, Decreased balance, Decreased coordination, Decreased range of motion, Impaired flexibility, Difficulty walking, Decreased strength, Pain, Impaired tone  Visit Diagnosis: Other abnormalities of gait and mobility  Muscle weakness (generalized)  Cramp and spasm     Problem List There are no active problems to display for this patient.    Lorrene Reid, PT 06/02/17 4:42 PM   [facility] [address], [address] [place], [place], [address] Phone: [phone]   Fax:  [phone]  Name: Fedor Kazmierski MRN: [id number] Date of Birth: Nov 29, 1990

## 2017-06-04 ENCOUNTER — Ambulatory Visit: Payer: Managed Care, Other (non HMO)

## 2017-06-04 DIAGNOSIS — M6281 Muscle weakness (generalized): Secondary | ICD-10-CM

## 2017-06-04 DIAGNOSIS — R2689 Other abnormalities of gait and mobility: Secondary | ICD-10-CM

## 2017-06-04 DIAGNOSIS — R252 Cramp and spasm: Secondary | ICD-10-CM | POA: Diagnosis not present

## 2017-06-04 NOTE — Therapy (Signed)
[facility] [address], [address] [place], [place], [address] Phone: [phone]   Fax:  [phone]  Physical Therapy Treatment  Patient Details  Name: Eddie CullMichael Valentine MRN: [id number] Date of Birth: 1990-11-18 Referring Provider: Jarrett SohoWharton, Courtney  Encounter Date: 06/04/2017      PT End of Session - 06/04/17 1659    Visit Number 20   Date for PT Re-Evaluation 07/16/17   Authorization - Visit Number 20   Authorization - Number of Visits 30   PT Start Time 1601   PT Stop Time 1642   PT Time Calculation (min) 41 min   Activity Tolerance Patient tolerated treatment well   Behavior During Therapy [facility] for tasks assessed/performed      Past Medical History:  Diagnosis Date  . Cerebral palsy [facility](HCC)     Past Surgical History:  Procedure Laterality Date  . DEEP BRAIN STIMULATOR PLACEMENT      There were no vitals filed for this visit.          [facility] - 06/04/17 0001      Assessment   Medical Diagnosis M62.838 - cervical paraspinal muscle spasms     Balance Screen   Has the patient fallen in the past 6 months Yes   How many times? 3  PT is treating for balance   Has the patient had a decrease in activity level because of a fear of falling?  Yes   Is the patient reluctant to leave their home because of a fear of falling?  Yes     Home Environment   Living Environment Private residence   Living Arrangements Alone     Prior Function   Level of Independence Independent   Vocation Full time employment     Cognition   Overall Cognitive Status Within Functional Limits for tasks assessed     Posture/Postural Control   Posture/Postural Control Postural limitations   Postural Limitations Weight shift right   Posture Comments trunk extension, increased WB on right side in standing     Flexibility   Hamstrings Rt 65, Lt 60     Transfers   Five time sit to stand comments  20 seconds     Ambulation/Gait   Ambulation/Gait Yes   Gait Pattern Poor foot clearance - left;Poor foot clearance - right;Right flexed knee in stance;Left flexed knee in stance;Lateral trunk lean to right;Right steppage;Left steppage     Timed Up and Go Test   TUG Normal TUG   Normal TUG (seconds) 23                     [facility] Adult PT Treatment/Exercise - 06/04/17 0001      Ambulation/Gait   Gait Comments walking around gym.  2 laps, stopped early due to instability today.       Lumbar Exercises: Supine   Bridge --  3x5, TC to stabillize hips/ankles     Knee/Hip Exercises: Aerobic   Nustep level 1x 10 minutes  PT present to discuss status     Knee/Hip Exercises: Seated   Sit to Sand without UE support;10 reps;2 sets  good control with stand to sit     Manual Therapy   Passive ROM Bil gastroc stretch, HS stretch, hip rotators, hip flexors 3x 20 sec holds ;  bilateral trunk and hip rotation and SKTC                  PT Short Term Goals -  05/26/17 1610      PT SHORT TERM GOAL #2   Title TUG decreased to 14 sec or less due to improved strength and gait   Time 4   Period Weeks   Status On-going     PT SHORT TERM GOAL #3   Title independent with initial HEP   Status Achieved           PT Long Term Goals - 06/04/17 1620      PT LONG TERM GOAL #1   Title pt demonstrates TUG < or = to 12 sec for reduced risk of falls due to improved gait mechanics   Baseline 23 seconds   Time 6   Period Weeks   Status On-going   Target Date 07/16/17     PT LONG TERM GOAL #2   Title Pt demonstrates 5 x sit to stand < or = to 15 sec without plopping down into his chair due to improved strength and functional movements   Baseline 20 seconds- good control   Status Revised   Target Date 07/16/17     PT LONG TERM GOAL #3   Title ambulate for 4-5 minutes without rest and demonstrate safety with this distance to improve short community ambulation   Time 6   Period Weeks    Status Revised   Target Date 07/16/17     PT LONG TERM GOAL #4   Title independent with advanced HEP   Time 6   Period Weeks   Status On-going   Target Date 07/16/17               Plan - 06/04/17 1628    Clinical Impression Statement Pt is making improvement in mobility with PT.  Pt with improved hip flexibility and ease with stretching with P/ROM. Hamstring length (SLR) on Lt was 60 degrees and on Rt 65 degrees (improved from 30-35 at evaluation).  Pt able to perform 5x sit to stand in 20 seconds with good control with descent.  Pt with improved standing posture and stability with gait.  Pt is limited to walking 3 minutes in the clinic due to fatigue and instability with any longer distance.  Pt will continue to benefit from skilled PT for LE flexibility, strength and gait/functional mobility training.    PT Frequency 2x / week   PT Duration 6 weeks   PT Treatment/Interventions ADLs/Self Care Home Management;Biofeedback;Cryotherapy;Electrical Stimulation;Moist Heat;Ultrasound;Gait training;Stair training;Functional mobility training;Therapeutic activities;Therapeutic exercise;Balance training;Neuromuscular re-education;Patient/family education;Passive range of motion;Dry needling;Taping;Manual techniques   PT Next Visit Plan lumbar and hip ROM and stretches, LE strengthening, low level balance; gait training to build up endurance   Recommended Other Services initial certification signed, recert sent 06/04/17   Consulted and Agree with Plan of Care Patient      Patient will benefit from skilled therapeutic intervention in order to improve the following deficits and impairments:  Abnormal gait, Decreased balance, Decreased coordination, Decreased range of motion, Impaired flexibility, Difficulty walking, Decreased strength, Pain, Impaired tone  Visit Diagnosis: Other abnormalities of gait and mobility - Plan: PT plan of care cert/re-cert  Muscle weakness (generalized) - Plan: PT  plan of care cert/re-cert  Cramp and spasm - Plan: PT plan of care cert/re-cert     Problem List There are no active problems to display for this patient.   Lorrene Reid, PT 06/04/17 5:02 PM  [facility] [address], [address] [place], [place], [address] Phone: [phone]   Fax:  [phone]  Name: Aldrich Lloyd MRN: [id number] Date of Birth: [birth year]

## 2017-06-11 ENCOUNTER — Ambulatory Visit: Payer: Managed Care, Other (non HMO)

## 2017-06-11 DIAGNOSIS — R252 Cramp and spasm: Secondary | ICD-10-CM | POA: Diagnosis not present

## 2017-06-11 DIAGNOSIS — R2689 Other abnormalities of gait and mobility: Secondary | ICD-10-CM

## 2017-06-11 DIAGNOSIS — M6281 Muscle weakness (generalized): Secondary | ICD-10-CM

## 2017-06-11 NOTE — Therapy (Signed)
[facility] [address], [address] [place], [place], [address] Phone: [phone]   Fax:  [phone]  Physical Therapy Treatment  Patient Details  Name: Eddie Valentine MRN: [id number] Date of Birth: 07/02/1991 Referring Provider: Jarrett Soho  Encounter Date: 06/11/2017      PT End of Session - 06/11/17 1650    Visit Number 21   Date for PT Re-Evaluation 07/16/17   Authorization - Visit Number 21   Authorization - Number of Visits 30   PT Start Time 1610   PT Stop Time 1650   PT Time Calculation (min) 40 min   Activity Tolerance Patient tolerated treatment well   Behavior During Therapy [facility] for tasks assessed/performed      Past Medical History:  Diagnosis Date  . Cerebral palsy [facility])     Past Surgical History:  Procedure Laterality Date  . DEEP BRAIN STIMULATOR PLACEMENT      There were no vitals filed for this visit.      Subjective Assessment - 06/11/17 1620    Subjective My pain is not bad today.     Currently in Pain? Yes   Pain Score 4    Pain Location Back   Pain Orientation Lower   Pain Descriptors / Indicators Aching   Pain Type Chronic pain   Pain Onset More than a month ago   Pain Frequency Constant   Aggravating Factors  it just hurts   Pain Relieving Factors stetches, heat                         [facility] Adult PT Treatment/Exercise - 06/11/17 0001      Ambulation/Gait   Gait Comments walking around gym.  4 laps     Lumbar Exercises: Stretches   Lower Trunk Rotation 10 seconds  10 reps to each side using red ball     Lumbar Exercises: Supine   Clam 20 reps   Clam Limitations green band   Bridge 20 reps  assistance not needed     Knee/Hip Exercises: Aerobic   Nustep level 2x 10 minutes  PT present to discuss status     Knee/Hip Exercises: Seated   Sit to Sand without UE support;10 reps;2 sets  good control with stand to sit     Moist Heat Therapy   Number Minutes Moist Heat 20 Minutes   Moist Heat Location Lumbar Spine  during exercise     Manual Therapy   Passive ROM Bil gastroc stretch, HS stretch, hip rotators, hip flexors 3x 20 sec holds ;  bilateral trunk and hip rotation and SKTC                  PT Short Term Goals - 05/26/17 1610      PT SHORT TERM GOAL #2   Title TUG decreased to 14 sec or less due to improved strength and gait   Time 4   Period Weeks   Status On-going     PT SHORT TERM GOAL #3   Title independent with initial HEP   Status Achieved           PT Long Term Goals - 06/04/17 1620      PT LONG TERM GOAL #1   Title pt demonstrates TUG < or = to 12 sec for reduced risk of falls due to improved gait mechanics   Baseline 23 seconds   Time 6   Period Weeks  Status On-going   Target Date 07/16/17     PT LONG TERM GOAL #2   Title Pt demonstrates 5 x sit to stand < or = to 15 sec without plopping down into his chair due to improved strength and functional movements   Baseline 20 seconds- good control   Status Revised   Target Date 07/16/17     PT LONG TERM GOAL #3   Title ambulate for 4-5 minutes without rest and demonstrate safety with this distance to improve short community ambulation   Time 6   Period Weeks   Status Revised   Target Date 07/16/17     PT LONG TERM GOAL #4   Title independent with advanced HEP   Time 6   Period Weeks   Status On-going   Target Date 07/16/17               Plan - 06/11/17 1621    Clinical Impression Statement Pt is making improvement in mobility with PT.  Pt with improved hip flexibility and ease with stretching with P/ROM.  SLR was improved last session.  Pt demonstrated improved control with descent with sit to stand.  Pt will continue to benefit from skilled PT for flexibility, strength and gait to improve safety with gait and to reduce pain.     Rehab Potential Excellent   PT Frequency 2x / week   PT Duration 6 weeks   PT  Treatment/Interventions ADLs/Self Care Home Management;Biofeedback;Cryotherapy;Electrical Stimulation;Moist Heat;Ultrasound;Gait training;Stair training;Functional mobility training;Therapeutic activities;Therapeutic exercise;Balance training;Neuromuscular re-education;Patient/family education;Passive range of motion;Dry needling;Taping;Manual techniques   PT Next Visit Plan lumbar and hip ROM and stretches, LE strengthening, low level balance; gait training to build up endurance   Consulted and Agree with Plan of Care Patient      Patient will benefit from skilled therapeutic intervention in order to improve the following deficits and impairments:  Abnormal gait, Decreased balance, Decreased coordination, Decreased range of motion, Impaired flexibility, Difficulty walking, Decreased strength, Pain, Impaired tone  Visit Diagnosis: Other abnormalities of gait and mobility  Muscle weakness (generalized)  Cramp and spasm     Problem List There are no active problems to display for this patient.    Lorrene ReidKelly Mellonie Guess, PT 06/11/17 4:52 PM  [facility] [address], [address] [place], [place], [address] Phone: [phone]   Fax:  [phone]  Name: Gershon CullMichael Fujiwara MRN: [id number] Date of Birth: [birth year]

## 2017-06-16 ENCOUNTER — Ambulatory Visit: Payer: Managed Care, Other (non HMO)

## 2017-06-16 DIAGNOSIS — R2689 Other abnormalities of gait and mobility: Secondary | ICD-10-CM

## 2017-06-16 DIAGNOSIS — R252 Cramp and spasm: Secondary | ICD-10-CM

## 2017-06-16 DIAGNOSIS — M6281 Muscle weakness (generalized): Secondary | ICD-10-CM

## 2017-06-16 NOTE — Therapy (Signed)
[facility] [address], [address] [place], [place], [address] Phone: [phone]   Fax:  [phone]  Physical Therapy Treatment  Patient Details  Name: Eddie CullMichael Valentine MRN: [id number] Date of Birth: 07/06/1991 Referring Provider: Jarrett SohoWharton, Courtney  Encounter Date: 06/16/2017      PT End of Session - 06/16/17 0846    Visit Number 22   Date for PT Re-Evaluation 07/16/17   Authorization - Visit Number 22   Authorization - Number of Visits 30   PT Start Time 0805   PT Stop Time 0846   PT Time Calculation (min) 41 min   Activity Tolerance Patient tolerated treatment well   Behavior During Therapy [facility] for tasks assessed/performed      Past Medical History:  Diagnosis Date  . Cerebral palsy [facility](HCC)     Past Surgical History:  Procedure Laterality Date  . DEEP BRAIN STIMULATOR PLACEMENT      There were no vitals filed for this visit.      Subjective Assessment - 06/16/17 0833    Subjective I am doing OK this morning.     Currently in Pain? Yes   Pain Score 7    Pain Location Back   Pain Orientation Lower;Right;Left   Pain Descriptors / Indicators Aching   Pain Type Chronic pain   Pain Onset More than a month ago   Pain Frequency Constant   Aggravating Factors  it just hurts, morning hours   Pain Relieving Factors stretches, heat                         [facility] Adult PT Treatment/Exercise - 06/16/17 0001      Ambulation/Gait   Gait Comments walking around gym.  4 laps- 3 minutes, 20 seconds     Lumbar Exercises: Stretches   Lower Trunk Rotation 10 seconds  10 reps to each side using red ball     Lumbar Exercises: Supine   Clam 20 reps   Clam Limitations green band   Bridge 20 reps  assistance not needed     Knee/Hip Exercises: Aerobic   Nustep level 2x 8 minutes  PT present to discuss status     Knee/Hip Exercises: Seated   Sit to Sand without UE support;10 reps;2 sets  good control  with stand to sit     Moist Heat Therapy   Number Minutes Moist Heat 20 Minutes   Moist Heat Location Lumbar Spine  during exercise     Manual Therapy   Passive ROM Bil gastroc stretch, HS stretch, hip rotators, hip flexors 3x 20 sec holds ;  bilateral trunk and hip rotation and SKTC                  PT Short Term Goals - 05/26/17 1610      PT SHORT TERM GOAL #2   Title TUG decreased to 14 sec or less due to improved strength and gait   Time 4   Period Weeks   Status On-going     PT SHORT TERM GOAL #3   Title independent with initial HEP   Status Achieved           PT Long Term Goals - 06/04/17 1620      PT LONG TERM GOAL #1   Title pt demonstrates TUG < or = to 12 sec for reduced risk of falls due to improved gait mechanics   Baseline 23 seconds   Time  6   Period Weeks   Status On-going   Target Date 07/16/17     PT LONG TERM GOAL #2   Title Pt demonstrates 5 x sit to stand < or = to 15 sec without plopping down into his chair due to improved strength and functional movements   Baseline 20 seconds- good control   Status Revised   Target Date 07/16/17     PT LONG TERM GOAL #3   Title ambulate for 4-5 minutes without rest and demonstrate safety with this distance to improve short community ambulation   Time 6   Period Weeks   Status Revised   Target Date 07/16/17     PT LONG TERM GOAL #4   Title independent with advanced HEP   Time 6   Period Weeks   Status On-going   Target Date 07/16/17               Plan - 06/16/17 0815    Clinical Impression Statement Pt is making improvement in mobility with PT. Pt with improved hip flexibility and ease with stretching with P/ROM.   Hip flexibility is limited but improved with stretching each session.  Pt with improved standing posture and stability with gait.  Pt will continue to benefit form skilled PT for flexibility, strength and gait to improve safety with gait and reduce LBP.     Rehab  Potential Excellent   PT Frequency 2x / week   PT Duration 6 weeks   PT Treatment/Interventions ADLs/Self Care Home Management;Biofeedback;Cryotherapy;Electrical Stimulation;Moist Heat;Ultrasound;Gait training;Stair training;Functional mobility training;Therapeutic activities;Therapeutic exercise;Balance training;Neuromuscular re-education;Patient/family education;Passive range of motion;Dry needling;Taping;Manual techniques   PT Next Visit Plan lumbar and hip ROM and stretches, LE strengthening, low level balance; gait training to build up endurance   Consulted and Agree with Plan of Care Patient      Patient will benefit from skilled therapeutic intervention in order to improve the following deficits and impairments:  Abnormal gait, Decreased balance, Decreased coordination, Decreased range of motion, Impaired flexibility, Difficulty walking, Decreased strength, Pain, Impaired tone  Visit Diagnosis: Other abnormalities of gait and mobility  Muscle weakness (generalized)  Cramp and spasm     Problem List There are no active problems to display for this patient.    Lorrene Reid, PT 06/16/17 8:47 AM  [facility] [address], [address] [place], [place], [address] Phone: [phone]   Fax:  [phone]  Name: Eddie Valentine MRN: [id number] Date of Birth: February 05, 1991

## 2017-06-18 ENCOUNTER — Encounter: Payer: Self-pay | Admitting: Physical Therapy

## 2017-07-01 ENCOUNTER — Ambulatory Visit: Payer: Managed Care, Other (non HMO) | Attending: Family Medicine

## 2017-07-01 DIAGNOSIS — R252 Cramp and spasm: Secondary | ICD-10-CM | POA: Insufficient documentation

## 2017-07-01 DIAGNOSIS — R2689 Other abnormalities of gait and mobility: Secondary | ICD-10-CM | POA: Diagnosis present

## 2017-07-01 DIAGNOSIS — M6281 Muscle weakness (generalized): Secondary | ICD-10-CM | POA: Insufficient documentation

## 2017-07-01 NOTE — Therapy (Signed)
[facility] [address], [address] [place], [place], [address] Phone: [phone]   Fax:  [phone]  Physical Therapy Treatment  Patient Details  Name: Eddie Valentine MRN: [id number] Date of Birth: Jan 09, 1991 Referring Provider: Jarrett SohoWharton, Courtney   Encounter Date: 07/01/2017  PT End of Session - 07/01/17 1246    Visit Number  23    Date for PT Re-Evaluation  07/16/17    Authorization - Visit Number  23    Authorization - Number of Visits  30    PT Start Time  1225    PT Stop Time  1307    PT Time Calculation (min)  42 min    Activity Tolerance  Patient tolerated treatment well    Behavior During Therapy  [facility] for tasks assessed/performed       Past Medical History:  Diagnosis Date  . Cerebral palsy [facility](HCC)     Past Surgical History:  Procedure Laterality Date  . DEEP BRAIN STIMULATOR PLACEMENT      There were no vitals filed for this visit.  Subjective Assessment - 07/01/17 1225    Subjective  Pt's dad died and he had to travel to [place] last week.      Currently in Pain?  Yes    Pain Score  7     Pain Location  Back    Pain Orientation  Lower;Right;Left    Pain Descriptors / Indicators  Aching    Pain Type  Chronic pain    Pain Onset  More than a month ago    Pain Frequency  Constant    Aggravating Factors   it hurts, travel, morning hours    Pain Relieving Factors  stretches, heat         [facility] PT Assessment - 07/01/17 0001      Standardized Balance Assessment   Five times sit to stand comments   16.85 seconds                  [facility] Adult PT Treatment/Exercise - 07/01/17 0001      Ambulation/Gait   Gait Comments  walking around gym.  4 laps- 3 minutes, 7 seconds      Lumbar Exercises: Stretches   Lower Trunk Rotation  10 seconds 10 reps to each side using red ball      Lumbar Exercises: Supine   Clam  20 reps    Clam Limitations  green band    Bridge  20 reps assistance not  needed      Knee/Hip Exercises: Aerobic   Nustep  level 2x 10 minutes PT present to discuss status      Knee/Hip Exercises: Seated   Sit to Sand  without UE support;10 reps;2 sets good control with stand to sit      Moist Heat Therapy   Number Minutes Moist Heat  20 Minutes    Moist Heat Location  Lumbar Spine during exercise      Manual Therapy   Passive ROM  Bil gastroc stretch, HS stretch, hip rotators, hip flexors 3x 20 sec holds ;  bilateral trunk and hip rotation and SKTC               PT Short Term Goals - 05/26/17 1610      PT SHORT TERM GOAL #2   Title  TUG decreased to 14 sec or less due to improved strength and gait    Time  4    Period  Weeks  Status  On-going      PT SHORT TERM GOAL #3   Title  independent with initial HEP    Status  Achieved        PT Long Term Goals - 07/01/17 1230      PT LONG TERM GOAL #2   Title  Pt demonstrates 5 x sit to stand < or = to 15 sec without plopping down into his chair due to improved strength and functional movements    Baseline  16.85 seconds    Time  12    Period  Weeks    Status  On-going      PT LONG TERM GOAL #3   Title  ambulate for 4-5 minutes without rest and demonstrate safety with this distance to improve short community ambulation    Baseline  it varies    Time  6    Period  Weeks    Status  On-going            Plan - 07/01/17 1230    Clinical Impression Statement  Pt missed PT last week due to death of his father.  Pt with chronic A/ROM deficits due to spasticity associated with CP.  Hip flexibility is improved after stretching today.  Pt with reduced control with sit to stand today due to lapse in treatment. Pt able to walk 4 laps around the gym in less time today (13 seconds faster). Pt will continue to benefit from skilled PT for flexibility, strength and gait to improve safety with gati and reduce LBP    Rehab Potential  Excellent    PT Frequency  2x / week    PT Duration  6 weeks     PT Treatment/Interventions  ADLs/Self Care Home Management;Biofeedback;Cryotherapy;Electrical Stimulation;Moist Heat;Ultrasound;Gait training;Stair training;Functional mobility training;Therapeutic activities;Therapeutic exercise;Balance training;Neuromuscular re-education;Patient/family education;Passive range of motion;Dry needling;Taping;Manual techniques    PT Next Visit Plan  lumbar and hip ROM and stretches, LE strengthening, low level balance; gait training to build up endurance    Recommended Other Services  initial certificaiton and recert signed    Consulted and Agree with Plan of Care  Patient       Patient will benefit from skilled therapeutic intervention in order to improve the following deficits and impairments:  Abnormal gait, Decreased balance, Decreased coordination, Decreased range of motion, Impaired flexibility, Difficulty walking, Decreased strength, Pain, Impaired tone  Visit Diagnosis: Other abnormalities of gait and mobility  Muscle weakness (generalized)  Cramp and spasm     Problem List There are no active problems to display for this patient.   Lorrene ReidKelly Zaccai Chavarin, PT 07/01/17 1:13 PM  [facility] [address], [address] [place], [place], [address] Phone: [phone]   Fax:  [phone]  Name: Eddie Valentine MRN: [id number] Date of Birth: 10-29-1990

## 2017-07-03 ENCOUNTER — Ambulatory Visit: Payer: Managed Care, Other (non HMO)

## 2017-07-03 DIAGNOSIS — M6281 Muscle weakness (generalized): Secondary | ICD-10-CM

## 2017-07-03 DIAGNOSIS — R2689 Other abnormalities of gait and mobility: Secondary | ICD-10-CM

## 2017-07-03 DIAGNOSIS — R252 Cramp and spasm: Secondary | ICD-10-CM

## 2017-07-03 NOTE — Therapy (Signed)
[facility] [address] [place], [place], [address] Phone: [phone]   Fax:  [phone]  Physical Therapy Treatment  Patient Details  Name: Eddie Valentine MRN: [id number] Date of Birth: 07/30/1991 Referring Provider: Marda Stalker   Encounter Date: 07/03/2017  PT End of Session - 07/03/17 1304    Visit Number  24    Date for PT Re-Evaluation  07/16/17    Authorization - Visit Number  24    Authorization - Number of Visits  30    PT Start Time  7209    PT Stop Time  1309    PT Time Calculation (min)  39 min    Activity Tolerance  Patient tolerated treatment well    Behavior During Therapy  [facility] for tasks assessed/performed       Past Medical History:  Diagnosis Date  . Cerebral palsy [facility])     Past Surgical History:  Procedure Laterality Date  . DEEP BRAIN STIMULATOR PLACEMENT      There were no vitals filed for this visit.  Subjective Assessment - 07/03/17 1234    Subjective  Ready for D/C now.  I have been walking so much better.  My family noticed when I was visiting.      Currently in Pain?  Yes    Pain Score  6     Pain Location  Back    Pain Orientation  Right;Lower;Left    Pain Descriptors / Indicators  Aching    Pain Type  Chronic pain    Pain Onset  More than a month ago    Pain Frequency  Constant         [facility] PT Assessment - 07/03/17 0001      Assessment   Medical Diagnosis  M62.838 - cervical paraspinal muscle spasms      Cognition   Overall Cognitive Status  Within Functional Limits for tasks assessed      Flexibility   Hamstrings  Rt 65, Lt 57      Ambulation/Gait   Gait Pattern  Poor foot clearance - left;Poor foot clearance - right;Right flexed knee in stance;Left flexed knee in stance;Lateral trunk lean to right;Trunk flexed;Right steppage;Left steppage      Standardized Balance Assessment   Five times sit to stand comments   16.85 seconds      Timed Up and Go  Test   TUG  Normal TUG    Normal TUG (seconds)  20.7                  [facility] Adult PT Treatment/Exercise - 07/03/17 0001      Ambulation/Gait   Gait Comments  walking around gym.  2 laps verbal cues      Lumbar Exercises: Stretches   Lower Trunk Rotation  10 seconds 10 reps to each side using red ball      Lumbar Exercises: Supine   Clam  20 reps    Clam Limitations  green band    Bridge  20 reps assistance not needed      Knee/Hip Exercises: Aerobic   Nustep  level 2x 10 minutes PT present to discuss status      Knee/Hip Exercises: Seated   Sit to Sand  without UE support;10 reps;2 sets good control with stand to sit      Moist Heat Therapy   Number Minutes Moist Heat  20 Minutes    Moist Heat Location  Lumbar Spine during exercise  Manual Therapy   Passive ROM  Bil gastroc stretch, HS stretch, hip rotators, hip flexors 3x 20 sec holds ;  bilateral trunk and hip rotation and SKTC               PT Short Term Goals - 05/26/17 1610      PT SHORT TERM GOAL #2   Title  TUG decreased to 14 sec or less due to improved strength and gait    Time  4    Period  Weeks    Status  On-going      PT SHORT TERM GOAL #3   Title  independent with initial HEP    Status  Achieved        PT Long Term Goals - 07/03/17 1235      PT LONG TERM GOAL #1   Title  pt demonstrates TUG < or = to 12 sec for reduced risk of falls due to improved gait mechanics    Baseline  20 seconds    Status  Partially Met      PT LONG TERM GOAL #2   Title  Pt demonstrates 5 x sit to stand < or = to 15 sec without plopping down into his chair due to improved strength and functional movements    Status  Achieved      PT LONG TERM GOAL #3   Title  ambulate for 4-5 minutes without rest and demonstrate safety with this distance to improve short community ambulation    Status  Partially Met      PT LONG TERM GOAL #4   Title  independent with advanced HEP    Status  Achieved             Plan - 07/03/17 1255    Clinical Impression Statement  Pt will be discharged to HEP today.  TUG is improved to 20 seconds and 5x sit to stand is 16.85 seconds (improved from 45 seconds at evaluation).  Pt with improved gait pattern with improved trunk posture and foot clearance.  Hamstring length and hip flexibility is improved with stretching in PT.  Pt will D/C to HEP and return to PT in a couple of months to address spasticity and weakness.      PT Next Visit Plan  D/C PT to HEP    Consulted and Agree with Plan of Care  Patient       Patient will benefit from skilled therapeutic intervention in order to improve the following deficits and impairments:     Visit Diagnosis: Other abnormalities of gait and mobility  Muscle weakness (generalized)  Cramp and spasm     Problem List There are no active problems to display for this patient.  PHYSICAL THERAPY DISCHARGE SUMMARY  Visits from Start of Care: 24  Current functional level related to goals / functional outcomes: See above for status.  Pt with improved gait and hip P/ROM.     Remaining deficits: See above.  Pt will continue with HEP and return as needed for flexibility.     Education / Equipment: HEP  Plan: Patient agrees to discharge.  Patient goals were partially met. Patient is being discharged due to being pleased with the current functional level.  ?????         Sigurd Sos, PT 07/03/17 1:11 PM  [facility] [address] [place], [place], [address] Phone: [phone]   Fax:  [phone]  Name: Kamon Fahr MRN: [id number]  Date of Birth: 1991-05-21

## 2017-08-21 ENCOUNTER — Ambulatory Visit: Payer: Managed Care, Other (non HMO) | Attending: Family Medicine | Admitting: Physical Therapy

## 2017-08-21 ENCOUNTER — Encounter: Payer: Self-pay | Admitting: Physical Therapy

## 2017-08-21 DIAGNOSIS — R2689 Other abnormalities of gait and mobility: Secondary | ICD-10-CM | POA: Insufficient documentation

## 2017-08-21 DIAGNOSIS — R252 Cramp and spasm: Secondary | ICD-10-CM

## 2017-08-21 DIAGNOSIS — M6281 Muscle weakness (generalized): Secondary | ICD-10-CM | POA: Diagnosis present

## 2017-08-21 NOTE — Therapy (Signed)
[facility] [address], [address] [place], [place], [address] Phone: [phone]   Fax:  [phone]  Physical Therapy Evaluation  Patient Details  Name: Eddie Valentine MRN: [id number] Date of Birth: 04/01/1991 Referring Provider: Jarrett Soho, PA   Encounter Date: 08/21/2017  PT End of Session - 08/21/17 1512    Visit Number  1    Date for PT Re-Evaluation  10/16/17    Authorization Type  [employer]ign[employer]    PT Start Time  1445    PT Stop Time  1520    PT Time Calculation (min)  35 min    Activity Tolerance  Patient tolerated treatment well    Behavior During Therapy  [facility] for tasks assessed/performed       Past Medical History:  Diagnosis Date  . Cerebral palsy [facility])     Past Surgical History:  Procedure Laterality Date  . DEEP BRAIN STIMULATOR PLACEMENT      There were no vitals filed for this visit.   Subjective Assessment - 08/21/17 1455    Subjective  I feel like I am still off balance.  Back pain.     Pertinent History  cerebal palsy, dystonia    Limitations  Walking    How long can you walk comfortably?  a couple times down and back in hall    Patient Stated Goals  get walking better and get pain level down    Currently in Pain?  Yes    Pain Score  8     Pain Location  Back    Pain Orientation  Right;Left;Lower    Pain Descriptors / Indicators  Sharp;Pressure    Pain Type  Chronic pain    Pain Onset  More than a month ago    Pain Frequency  Intermittent    Aggravating Factors   morning, walking alot    Pain Relieving Factors  stretch and heat         [facility] PT Assessment - 08/21/17 0001      Assessment   Medical Diagnosis  M62.838 - cervical paraspinal muscle spasms    Referring Provider  Jarrett Soho, PA    Onset Date/Surgical Date  08/21/16    Prior Therapy  Yes      Precautions   Precautions  None      Restrictions   Weight Bearing Restrictions  No      Balance Screen   Has the patient  fallen in the past 6 months  Yes    How many times?  3    Has the patient had a decrease in activity level because of a fear of falling?   Yes    Is the patient reluctant to leave their home because of a fear of falling?   No      Home Public house manager residence    Living Arrangements  Alone      Prior Function   Level of Independence  Independent    Vocation  Full time employment    Vocation Requirements  sitting, desk      Cognition   Overall Cognitive Status  Within Functional Limits for tasks assessed      Observation/Other Assessments   Observations  flexion throughout upper and lower extrmemities due to muscle spasms      Posture/Postural Control   Posture/Postural Control  Postural limitations    Postural Limitations  Weight shift right    Posture Comments  trunk  extension, increased WB on right side in standing      ROM / Strength   AROM / PROM / Strength  AROM;PROM;Strength      AROM   Cervical Extension  extend cervical to the right with right upper trap contracted    Cervical - Left Rotation  decreased by 25% with pain      Flexibility   Hamstrings  RT 65, left 60      Ambulation/Gait   Gait Pattern  Poor foot clearance - left;Poor foot clearance - right;Right flexed knee in stance;Left flexed knee in stance;Lateral trunk lean to right;Trunk flexed;Right steppage;Left steppage      Standardized Balance Assessment   Five times sit to stand comments   27 sec      Timed Up and Go Test   TUG  Normal TUG    Normal TUG (seconds)  19.56             Objective measurements completed on examination: See above findings.      [facility] Adult PT Treatment/Exercise - 08/21/17 0001      Knee/Hip Exercises: Aerobic   Nustep  level 2x 10 minutes PT present to make goals for the evaluation               PT Short Term Goals - 08/21/17 1518      PT SHORT TERM GOAL #1   Title  independent with initial HEP    Time  4    Period   Weeks    Status  New    Target Date  09/18/17      PT SHORT TERM GOAL #2   Title  TUG decreased to 16 sec or less due to improved strength and gait    Time  4    Period  Weeks    Status  New    Target Date  09/18/17      PT SHORT TERM GOAL #3   Title  sit to stand 19 seconds due to increased muscle control    Time  4    Period  Weeks    Status  New        PT Long Term Goals - 08/21/17 1520      PT LONG TERM GOAL #1   Title  pt demonstrates TUG < or = to 13 sec for reduced risk of falls due to improved gait mechanics    Time  8    Period  Weeks    Status  New    Target Date  10/16/17      PT LONG TERM GOAL #2   Title  Pt demonstrates 5 x sit to stand < or = to 15 sec without plopping down into his chair due to improved strength and functional movements    Time  8    Period  Weeks    Status  New    Target Date  10/16/17      PT LONG TERM GOAL #3   Title  ambulate for 2 minutes without rest and demonstrate safety with this distance to improve short community ambulation    Time  8    Period  Weeks    Status  New    Target Date  10/16/17      PT LONG TERM GOAL #4   Title  independent with advanced HEP    Time  8    Period  Weeks    Status  New    Target  Date  10/16/17             Plan - 08/21/17 1513    Clinical Impression Statement  Patient is a 27 year old male with muscle tightness, balance/gait, mobility loss associated with cerebral palsy.  Patient reports increased back pain at level 8/10 intermittently with standing and walking activities and in the morning.  TUG score is 19.56 seconds putting him at risk of falls.  Patient has fallen 3 times in the past 6 months.  Patient sit to stand score is 21 seconds indicating risk of falls.  Patient gait pattern includs poor foot clearance, and hyperextension of the spine.  Patient has tight hamstring making it difficulty to sit in a chair and pull on his back increasing back pain.  Patient will benefit from skilled  therapy to improve postural control, gait endurance, balance, hamstring length, reduce back pain.     History and Personal Factors relevant to plan of care:  CP    Clinical Presentation  Evolving    Clinical Presentation due to:  Patient has regressed some since previous PT in the past few months    Clinical Decision Making  Moderate    Rehab Potential  Excellent    Clinical Impairments Affecting Rehab Potential  CP    PT Frequency  2x / week    PT Duration  8 weeks    PT Treatment/Interventions  ADLs/Self Care Home Management;Biofeedback;Cryotherapy;Electrical Stimulation;Moist Heat;Ultrasound;Gait training;Stair training;Functional mobility training;Therapeutic activities;Therapeutic exercise;Balance training;Neuromuscular re-education;Patient/family education;Passive range of motion;Dry needling;Taping;Manual techniques    PT Next Visit Plan  stretching of hamstring, postural control, balance exercises, modalities as needed for back pain.     PT Home Exercise Plan  progress as needed    Consulted and Agree with Plan of Care  Patient       Patient will benefit from skilled therapeutic intervention in order to improve the following deficits and impairments:  Abnormal gait, Decreased balance, Decreased coordination, Decreased range of motion, Impaired flexibility, Difficulty walking, Decreased strength, Pain, Impaired tone  Visit Diagnosis: Other abnormalities of gait and mobility - Plan: PT plan of care cert/re-cert  Muscle weakness (generalized) - Plan: PT plan of care cert/re-cert  Cramp and spasm - Plan: PT plan of care cert/re-cert     Problem List There are no active problems to display for this patient.   Eulis Foster, PT 08/21/17 3:24 PM   [facility] [address] [place], [place], [address] Phone: [phone]   Fax:  [phone]  Name: Eddie Valentine MRN: [id number] Date of Birth: 09-28-1990

## 2017-08-25 ENCOUNTER — Ambulatory Visit: Payer: Managed Care, Other (non HMO)

## 2017-08-25 DIAGNOSIS — R2689 Other abnormalities of gait and mobility: Secondary | ICD-10-CM

## 2017-08-25 DIAGNOSIS — R252 Cramp and spasm: Secondary | ICD-10-CM

## 2017-08-25 DIAGNOSIS — M6281 Muscle weakness (generalized): Secondary | ICD-10-CM

## 2017-08-25 NOTE — Therapy (Addendum)
[facility] [address], [address] [place], [place], [address] Phone: [phone]   Fax:  [phone]  Physical Therapy Treatment  Patient Details  Name: Eddie CullMichael Feely MRN: [id number] Date of Birth: [birth year] Referring Provider: Jarrett Sohoourtney Wharton, PA   Encounter Date: 08/25/2017  PT End of Session - 08/25/17 1443    Visit Number  2    Date for PT Re-Evaluation  10/16/17    Authorization - Visit Number  2    Authorization - Number of Visits  30    PT Start Time  1400    PT Stop Time  1443    PT Time Calculation (min)  43 min    Activity Tolerance  Patient tolerated treatment well    Behavior During Therapy  [facility] for tasks assessed/performed       Past Medical History:  Diagnosis Date  . Cerebral palsy [facility](HCC)     Past Surgical History:  Procedure Laterality Date  . DEEP BRAIN STIMULATOR PLACEMENT      There were no vitals filed for this visit.  Subjective Assessment - 08/25/17 1403    Subjective  I feel like I am not doing as well as I was a few months ago.      Pertinent History  cerebal palsy, dystonia    Currently in Pain?  Yes    Pain Score  8     Pain Location  Back    Pain Orientation  Right;Left;Lower    Pain Descriptors / Indicators  Sharp;Pressure    Pain Type  Chronic pain    Pain Onset  More than a month ago    Pain Frequency  Intermittent    Aggravating Factors   it hurts all the time, walking a lot     Pain Relieving Factors  stretch, heat                      [facility] Adult PT Treatment/Exercise - 08/25/17 0001      Lumbar Exercises: Stretches   Lower Trunk Rotation  10 seconds 10 reps to each side using red ball      Lumbar Exercises: Seated   Other Seated Lumbar Exercises  seated cervical A/ROM 3x20 seconds      Lumbar Exercises: Supine   Clam  20 reps    Clam Limitations  green band    Bridge  20 reps assist to keep legs together      Knee/Hip Exercises: Aerobic   Nustep   level 2x 10 minutes PT present to discuss progress      Knee/Hip Exercises: Seated   Sit to Sand  without UE support;10 reps;2 sets good control with stand to sit      Moist Heat Therapy   Number Minutes Moist Heat  --    Moist Heat Location  --      Manual Therapy   Passive ROM  Bil gastroc stretch, HS stretch, hip rotators, hip flexors 3x 20 sec holds ;  bilateral trunk and hip rotation and SKTC             PT Education - 08/25/17 1439    Education provided  Yes    Education Details  cervical A/ROM, bridge, sit to stand    Person(s) Educated  Patient    Methods  Explanation;Demonstration;Handout    Comprehension  Verbalized understanding;Verbal cues required       PT Short Term Goals - 08/25/17 1405  PT SHORT TERM GOAL #1   Title  independent with initial HEP    Time  4    Period  Weeks    Status  On-going        PT Long Term Goals - 08/21/17 1520      PT LONG TERM GOAL #1   Title  pt demonstrates TUG < or = to 13 sec for reduced risk of falls due to improved gait mechanics    Time  8    Period  Weeks    Status  New    Target Date  10/16/17      PT LONG TERM GOAL #2   Title  Pt demonstrates 5 x sit to stand < or = to 15 sec without plopping down into his chair due to improved strength and functional movements    Time  8    Period  Weeks    Status  New    Target Date  10/16/17      PT LONG TERM GOAL #3   Title  ambulate for 2 minutes without rest and demonstrate safety with this distance to improve short community ambulation    Time  8    Period  Weeks    Status  New    Target Date  10/16/17      PT LONG TERM GOAL #4   Title  independent with advanced HEP    Time  8    Period  Weeks    Status  New    Target Date  10/16/17            Plan - 08/25/17 1408    Clinical Impression Statement  Pt with chronic muscle tightness and balance/gait deficits due to CP.  Pt with chronic LBP that is severe at times.  PT is working to improve  flexibility, control with sit to stand and gait endurance and safety.  Pt will continue to benefit from skilled PT for LE strength, flexibility, endurance and gait.    (Pended)     Rehab Potential  Excellent  (Pended)     PT Frequency  2x / week  (Pended)     PT Duration  8 weeks  (Pended)     PT Treatment/Interventions  ADLs/Self Care Home Management;Biofeedback;Cryotherapy;Electrical Stimulation;Moist Heat;Ultrasound;Gait training;Stair training;Functional mobility training;Therapeutic activities;Therapeutic exercise;Balance training;Neuromuscular re-education;Patient/family education;Passive range of motion;Dry needling;Taping;Manual techniques  (Pended)     PT Next Visit Plan  stretching of hamstring, postural control, balance exercises, modalities as needed for back pain.   (Pended)     Consulted and Agree with Plan of Care  Patient  (Pended)        Patient will benefit from skilled therapeutic intervention in order to improve the following deficits and impairments:  (P) Abnormal gait, Decreased balance, Decreased coordination, Decreased range of motion, Impaired flexibility, Difficulty walking, Decreased strength, Pain, Impaired tone  Visit Diagnosis: Other abnormalities of gait and mobility  Muscle weakness (generalized)  Cramp and spasm     Problem List There are no active problems to display for this patient.    Lorrene Reid, PT 08/25/17 2:44 PM  [facility] [address], [address] [place], [place], [address] Phone: [phone]   Fax:  [phone]  Name: Eddie Valentine MRN: [id number] Date of Birth: 1991/03/18

## 2017-08-25 NOTE — Patient Instructions (Addendum)
Knee Extension: Sit to Stand (Eccentric)    Stand close to chair. Slowly lower self to seated position. ___ reps per set, ___ sets per day, ___ days per week. Progress to stopping midway before lowering to chair. Progress to barely touching chair.  Copyright  VHI. All rights reserved.  Bridging    Slowly raise buttocks from floor, keeping stomach tight.  Hold 5 seconds Repeat __10__ times per set. Do _1__ sets per session. Do __2__ sessions per day.  http://orth.exer.us/1096   Copyright  VHI. All rights reserved.   PERFORM ALL EXERCISES GENTLY AND WITH GOOD POSTURE.    20 SECOND HOLD, 3 REPS TO EACH SIDE. 4-5 TIMES EACH DAY.   AROM: Neck Rotation   Turn head slowly to look over one shoulder, then the other.   AROM: Neck Flexion   Bend head forward.   AROM: Lateral Neck Flexion   Slowly tilt head toward one shoulder, then the other.  [facility] [address], [address] [place], [place] [address] Phone # [phone] Fax [phone]

## 2017-08-27 ENCOUNTER — Ambulatory Visit: Payer: Managed Care, Other (non HMO)

## 2017-08-27 DIAGNOSIS — R2689 Other abnormalities of gait and mobility: Secondary | ICD-10-CM

## 2017-08-27 DIAGNOSIS — R252 Cramp and spasm: Secondary | ICD-10-CM

## 2017-08-27 DIAGNOSIS — M6281 Muscle weakness (generalized): Secondary | ICD-10-CM

## 2017-08-27 NOTE — Therapy (Signed)
[facility] [address], [address] [place], [place], [address] Phone: [phone]   Fax:  [phone]  Physical Therapy Treatment  Patient Details  Name: Eddie Valentine MRN: [id number] Date of Birth: 1991/01/24 Referring Provider: Jarrett Soho, PA   Encounter Date: 08/27/2017  PT End of Session - 08/27/17 1653    Visit Number  3    Date for PT Re-Evaluation  10/16/17    Authorization Type  cigna    Authorization - Visit Number  3    Authorization - Number of Visits  30    PT Start Time  1610    PT Stop Time  1653    PT Time Calculation (min)  43 min    Activity Tolerance  Patient tolerated treatment well    Behavior During Therapy  [facility] for tasks assessed/performed       Past Medical History:  Diagnosis Date  . Cerebral palsy [facility])     Past Surgical History:  Procedure Laterality Date  . DEEP BRAIN STIMULATOR PLACEMENT      There were no vitals filed for this visit.  Subjective Assessment - 08/27/17 1610    Subjective  I just woke up from a nap.      Currently in Pain?  Yes    Pain Score  8     Pain Location  Back    Pain Orientation  Right;Left;Lower                      [facility] Adult PT Treatment/Exercise - 08/27/17 0001      Ambulation/Gait   Gait Comments  walking around the gym 3 laps: 2 minutes 45 seconds      Lumbar Exercises: Stretches   Lower Trunk Rotation  10 seconds 10 reps to each side using red ball      Lumbar Exercises: Seated   Other Seated Lumbar Exercises  seated cervical A/ROM 3x20 seconds use of mirror to reduce trunk substitution      Lumbar Exercises: Supine   Clam  20 reps    Clam Limitations  green band    Bridge  20 reps assist to keep legs together      Knee/Hip Exercises: Aerobic   Nustep  level 2x 10 minutes PT present to discuss progress      Knee/Hip Exercises: Seated   Sit to Sand  without UE support;10 reps;2 sets good control with stand to sit       Moist Heat Therapy   Number Minutes Moist Heat  20 Minutes    Moist Heat Location  Lumbar Spine during exercise      Manual Therapy   Passive ROM  Bil gastroc stretch, HS stretch, hip rotators, hip flexors 3x 20 sec holds ;  bilateral trunk and hip rotation and SKTC               PT Short Term Goals - 08/25/17 1405      PT SHORT TERM GOAL #1   Title  independent with initial HEP    Time  4    Period  Weeks    Status  On-going        PT Long Term Goals - 08/21/17 1520      PT LONG TERM GOAL #1   Title  pt demonstrates TUG < or = to 13 sec for reduced risk of falls due to improved gait mechanics    Time  8    Period  Weeks    Status  New    Target Date  10/16/17      PT LONG TERM GOAL #2   Title  Pt demonstrates 5 x sit to stand < or = to 15 sec without plopping down into his chair due to improved strength and functional movements    Time  8    Period  Weeks    Status  New    Target Date  10/16/17      PT LONG TERM GOAL #3   Title  ambulate for 2 minutes without rest and demonstrate safety with this distance to improve short community ambulation    Time  8    Period  Weeks    Status  New    Target Date  10/16/17      PT LONG TERM GOAL #4   Title  independent with advanced HEP    Time  8    Period  Weeks    Status  New    Target Date  10/16/17            Plan - 08/27/17 1615    Clinical Impression Statement  Pt with chronic muscle tightness, balance/gait, and strength deficits associated with cerebral palsy.  Pt with consistent LBP and rates it 8/10 with standing and walking.  Pt with improved control with sit to stand today and requires verbal cues when he begins to fatigue.  Pt with poor foot clearance with giat pattern.  Pt will benefit from continue skilled PT for flexibility, strength, endurance and gait.      Rehab Potential  Excellent    PT Frequency  2x / week    PT Duration  8 weeks    PT Treatment/Interventions  ADLs/Self Care Home  Management;Biofeedback;Cryotherapy;Electrical Stimulation;Moist Heat;Ultrasound;Gait training;Stair training;Functional mobility training;Therapeutic activities;Therapeutic exercise;Balance training;Neuromuscular re-education;Patient/family education;Passive range of motion;Dry needling;Taping;Manual techniques    PT Next Visit Plan  stretching of hamstring, postural control, balance exercises, modalities as needed for back pain.     Consulted and Agree with Plan of Care  Patient       Patient will benefit from skilled therapeutic intervention in order to improve the following deficits and impairments:  Abnormal gait, Decreased balance, Decreased coordination, Decreased range of motion, Impaired flexibility, Difficulty walking, Decreased strength, Pain, Impaired tone  Visit Diagnosis: Other abnormalities of gait and mobility  Muscle weakness (generalized)  Cramp and spasm     Problem List There are no active problems to display for this patient.    Lorrene ReidKelly Takacs, PT 08/27/17 4:54 PM  [facility] [address], [address] [place], [place], [address] Phone: [phone]   Fax:  [phone]  Name: Eddie Valentine MRN: [id number] Date of Birth: Apr 30, 1991

## 2017-09-03 ENCOUNTER — Ambulatory Visit: Payer: Managed Care, Other (non HMO)

## 2017-09-03 DIAGNOSIS — R2689 Other abnormalities of gait and mobility: Secondary | ICD-10-CM

## 2017-09-03 DIAGNOSIS — M6281 Muscle weakness (generalized): Secondary | ICD-10-CM

## 2017-09-03 DIAGNOSIS — R252 Cramp and spasm: Secondary | ICD-10-CM

## 2017-09-03 NOTE — Therapy (Signed)
[facility] [address], [address] [place], [place], [address] Phone: [phone]   Fax:  [phone]  Physical Therapy Treatment  Patient Details  Name: Eddie Valentine MRN: [id number] Date of Birth: 1991/07/30 Referring Provider: Jarrett Sohoourtney Wharton, PA   Encounter Date: 09/03/2017  PT End of Session - 09/03/17 1649    Visit Number  4    Date for PT Re-Evaluation  10/16/17    Authorization Type  cigna    Authorization - Visit Number  4    Authorization - Number of Visits  30    PT Start Time  1612    PT Stop Time  1647    PT Time Calculation (min)  35 min    Activity Tolerance  Patient limited by pain    Behavior During Therapy  [facility] for tasks assessed/performed       Past Medical History:  Diagnosis Date  . Cerebral palsy [facility](HCC)     Past Surgical History:  Procedure Laterality Date  . DEEP BRAIN STIMULATOR PLACEMENT      There were no vitals filed for this visit.  Subjective Assessment - 09/03/17 1626    Subjective  I had a fall yesterday.  I tripped on something while talking to someone.  I hit my Rt rib cage.      Currently in Pain?  Yes    Pain Score  7     Pain Location  Back    Pain Orientation  Right;Left;Lower;Upper    Pain Descriptors / Indicators  Aching;Tender    Pain Type  Chronic pain    Pain Onset  More than a month ago    Pain Frequency  Intermittent    Aggravating Factors   pt fell yesterday so pain is constant    Pain Relieving Factors  stretch, heat                      [facility] Adult PT Treatment/Exercise - 09/03/17 0001      Lumbar Exercises: Stretches   Lower Trunk Rotation  10 seconds 10 reps to each side using red ball      Lumbar Exercises: Supine   Clam  20 reps    Clam Limitations  green band    Bridge  20 reps assist to keep legs together      Knee/Hip Exercises: Aerobic   Nustep  level 2x 10 minutes PT present to discuss progress      Knee/Hip Exercises: Seated    Sit to Sand  without UE support;10 reps;2 sets good control with stand to sit      Moist Heat Therapy   Number Minutes Moist Heat  20 Minutes    Moist Heat Location  Lumbar Spine during exercise      Manual Therapy   Passive ROM  Bil gastroc stretch, HS stretch, hip rotators, hip flexors 3x 20 sec holds ;  bilateral trunk and hip rotation and SKTC               PT Short Term Goals - 09/03/17 1627      PT SHORT TERM GOAL #2   Title  TUG decreased to 16 sec or less due to improved strength and gait    Time  4    Status  On-going      PT SHORT TERM GOAL #3   Title  sit to stand 19 seconds due to increased muscle control    Time  4  Period  Weeks    Status  On-going        PT Long Term Goals - 08/21/17 1520      PT LONG TERM GOAL #1   Title  pt demonstrates TUG < or = to 13 sec for reduced risk of falls due to improved gait mechanics    Time  8    Period  Weeks    Status  New    Target Date  10/16/17      PT LONG TERM GOAL #2   Title  Pt demonstrates 5 x sit to stand < or = to 15 sec without plopping down into his chair due to improved strength and functional movements    Time  8    Period  Weeks    Status  New    Target Date  10/16/17      PT LONG TERM GOAL #3   Title  ambulate for 2 minutes without rest and demonstrate safety with this distance to improve short community ambulation    Time  8    Period  Weeks    Status  New    Target Date  10/16/17      PT LONG TERM GOAL #4   Title  independent with advanced HEP    Time  8    Period  Weeks    Status  New    Target Date  10/16/17            Plan - 09/03/17 1628    Clinical Impression Statement  Pt had a fall yesterday so he was not as mobile today due to pain.  Pt with increased stiffness on the Rt side of the thoracic and lumbar spine.  Pt with good sit to stand with eccentric control with both sets of this exercise today.  Bridges were level with improved height.  Pt will continue to  benefit from skilled PT for LE P/ROM, gait and LE strength to improve safety and mobility.    Rehab Potential  Excellent    PT Frequency  2x / week    PT Duration  8 weeks    PT Treatment/Interventions  ADLs/Self Care Home Management;Biofeedback;Cryotherapy;Electrical Stimulation;Moist Heat;Ultrasound;Gait training;Stair training;Functional mobility training;Therapeutic activities;Therapeutic exercise;Balance training;Neuromuscular re-education;Patient/family education;Passive range of motion;Dry needling;Taping;Manual techniques    PT Next Visit Plan  stretching of hamstring, postural control, balance exercises, modalities as needed for back pain.     Consulted and Agree with Plan of Care  Patient       Patient will benefit from skilled therapeutic intervention in order to improve the following deficits and impairments:  Abnormal gait, Decreased balance, Decreased coordination, Decreased range of motion, Impaired flexibility, Difficulty walking, Decreased strength, Pain, Impaired tone  Visit Diagnosis: Other abnormalities of gait and mobility  Muscle weakness (generalized)  Cramp and spasm     Problem List There are no active problems to display for this patient.    Lorrene Reid, PT 09/03/17 4:50 PM  [facility] [address] [place], [place], [address] Phone: [phone]   Fax:  [phone]  Name: Eddie Valentine MRN: [id number] Date of Birth: 08-06-1991

## 2017-09-08 ENCOUNTER — Ambulatory Visit: Payer: Managed Care, Other (non HMO)

## 2017-09-08 DIAGNOSIS — R2689 Other abnormalities of gait and mobility: Secondary | ICD-10-CM

## 2017-09-08 DIAGNOSIS — R252 Cramp and spasm: Secondary | ICD-10-CM

## 2017-09-08 DIAGNOSIS — M6281 Muscle weakness (generalized): Secondary | ICD-10-CM
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
## 2017-09-10 ENCOUNTER — Ambulatory Visit: Payer: Managed Care, Other (non HMO)

## 2017-09-10 DIAGNOSIS — R252 Cramp and spasm: Secondary | ICD-10-CM

## 2017-09-10 DIAGNOSIS — M6281 Muscle weakness (generalized): Secondary | ICD-10-CM

## 2017-09-10 DIAGNOSIS — R2689 Other abnormalities of gait and mobility: Secondary | ICD-10-CM | POA: Diagnosis not present

## 2017-09-10 NOTE — Therapy (Signed)
[facility] [address], [address] [place], [place], [address] Phone: [phone]   Fax:  [phone]  Physical Therapy Treatment  Patient Details  Name: Eddie Valentine MRN: [id number] Date of Birth: 1991/07/06 Referring Provider: Jarrett Sohoourtney Wharton, PA   Encounter Date: 09/10/2017  PT End of Session - 09/10/17 1648    Visit Number  6    Date for PT Re-Evaluation  10/16/17    Authorization - Visit Number  6    Authorization - Number of Visits  30    PT Start Time  1610    PT Stop Time  1649    PT Time Calculation (min)  39 min    Activity Tolerance  Patient tolerated treatment well    Behavior During Therapy  [facility] for tasks assessed/performed       Past Medical History:  Diagnosis Date  . Cerebral palsy [facility](HCC)     Past Surgical History:  Procedure Laterality Date  . DEEP BRAIN STIMULATOR PLACEMENT      There were no vitals filed for this visit.  Subjective Assessment - 09/10/17 1618    Subjective  I am feeling better.    Currently in Pain?  Yes    Pain Score  7     Pain Location  Back    Pain Orientation  Right;Left;Lower    Pain Descriptors / Indicators  Aching;Tender                      [facility] Adult PT Treatment/Exercise - 09/10/17 0001      Ambulation/Gait   Gait Comments  walking around the gym 4 laps- verbal cues for posture and speed      Lumbar Exercises: Stretches   Lower Trunk Rotation  10 seconds 10 reps to each side using red ball      Lumbar Exercises: Supine   Bridge  20 reps assist to keep legs together, legs on red ball      Knee/Hip Exercises: Aerobic   Nustep  level 2x 10 minutes PT present to discuss progress      Knee/Hip Exercises: Seated   Sit to Sand  without UE support;10 reps;2 sets good control with stand to sit      Moist Heat Therapy   Number Minutes Moist Heat  20 Minutes    Moist Heat Location  Lumbar Spine during exercise      Manual Therapy   Passive ROM   Bil gastroc stretch, HS stretch, hip rotators, hip flexors 3x 20 sec holds ;  bilateral trunk and hip rotation and SKTC               PT Short Term Goals - 09/03/17 1627      PT SHORT TERM GOAL #2   Title  TUG decreased to 16 sec or less due to improved strength and gait    Time  4    Status  On-going      PT SHORT TERM GOAL #3   Title  sit to stand 19 seconds due to increased muscle control    Time  4    Period  Weeks    Status  On-going        PT Long Term Goals - 08/21/17 1520      PT LONG TERM GOAL #1   Title  pt demonstrates TUG < or = to 13 sec for reduced risk of falls due to improved gait mechanics    Time  8    Period  Weeks    Status  New    Target Date  10/16/17      PT LONG TERM GOAL #2   Title  Pt demonstrates 5 x sit to stand < or = to 15 sec without plopping down into his chair due to improved strength and functional movements    Time  8    Period  Weeks    Status  New    Target Date  10/16/17      PT LONG TERM GOAL #3   Title  ambulate for 2 minutes without rest and demonstrate safety with this distance to improve short community ambulation    Time  8    Period  Weeks    Status  New    Target Date  10/16/17      PT LONG TERM GOAL #4   Title  independent with advanced HEP    Time  8    Period  Weeks    Status  New    Target Date  10/16/17            Plan - 09/10/17 1619    Clinical Impression Statement  Pt with reduced pain with activity today so able to participate in more activity in the clinic.  Pt was able to amublate in the clinic with independence and required verbal cues for posture.  Pt walked 4 laps with gait in the clinic and demonstrated improved stability today.  Pt with chronic stiffness and weakness due to CP.  Pt will benefit form skilled PT for flexibility, mobility and strength to improve safety.      Rehab Potential  Excellent    PT Frequency  2x / week    PT Duration  8 weeks    PT Treatment/Interventions   ADLs/Self Care Home Management;Biofeedback;Cryotherapy;Electrical Stimulation;Moist Heat;Ultrasound;Gait training;Stair training;Functional mobility training;Therapeutic activities;Therapeutic exercise;Balance training;Neuromuscular re-education;Patient/family education;Passive range of motion;Dry needling;Taping;Manual techniques    PT Next Visit Plan  stretching of hamstring, postural control, balance exercises, modalities as needed for back pain.     Consulted and Agree with Plan of Care  Patient       Patient will benefit from skilled therapeutic intervention in order to improve the following deficits and impairments:  Abnormal gait, Decreased balance, Decreased coordination, Decreased range of motion, Impaired flexibility, Difficulty walking, Decreased strength, Pain, Impaired tone  Visit Diagnosis: Other abnormalities of gait and mobility  Muscle weakness (generalized)  Cramp and spasm     Problem List There are no active problems to display for this patient.   Lorrene Reid, PT 09/10/17 4:51 PM  [facility] [address] [place], [place], [address] Phone: [phone]   Fax:  [phone]  Name: Eddie Valentine MRN: [id number] Date of Birth: Oct 03, 1990

## 2017-09-17 ENCOUNTER — Ambulatory Visit: Payer: Managed Care, Other (non HMO)

## 2017-09-17 DIAGNOSIS — R2689 Other abnormalities of gait and mobility: Secondary | ICD-10-CM

## 2017-09-17 DIAGNOSIS — R252 Cramp and spasm: Secondary | ICD-10-CM

## 2017-09-17 DIAGNOSIS — M6281 Muscle weakness (generalized): Secondary | ICD-10-CM

## 2017-09-17 NOTE — Therapy (Signed)
[facility] [address], [address] [place], [place], [address] Phone: [phone]   Fax:  [phone]  Physical Therapy Treatment  Patient Details  Name: Eddie Valentine MRN: [id number] Date of Birth: 11/30/1990 Referring Provider: Jarrett Soho, PA   Encounter Date: 09/17/2017  PT End of Session - 09/17/17 1659    Visit Number  7    Date for PT Re-Evaluation  10/16/17    Authorization Type  cigna    Authorization - Visit Number  7    Authorization - Number of Visits  30    PT Start Time  1615    PT Stop Time  1659    PT Time Calculation (min)  44 min    Activity Tolerance  Patient tolerated treatment well    Behavior During Therapy  [facility] for tasks assessed/performed       Past Medical History:  Diagnosis Date  . Cerebral palsy [facility])     Past Surgical History:  Procedure Laterality Date  . DEEP BRAIN STIMULATOR PLACEMENT      There were no vitals filed for this visit.  Subjective Assessment - 09/17/17 1633    Subjective  I was hurting all over on Monday so I missed my appointment.    Currently in Pain?  Yes    Pain Score  7     Pain Location  Back    Pain Orientation  Right;Lower    Pain Descriptors / Indicators  Aching;Tender    Pain Onset  More than a month ago    Pain Frequency  Intermittent    Aggravating Factors   constant    Pain Relieving Factors  stretch, heat         [facility] PT Assessment - 09/17/17 0001      Transfers   Five time sit to stand comments   19.04      Timed Up and Go Test   TUG  Normal TUG    Normal TUG (seconds)  15.1                  [facility] Adult PT Treatment/Exercise - 09/17/17 0001      Ambulation/Gait   Gait Comments  walking around the gym 6 laps- verbal cues for posture and speed      Lumbar Exercises: Stretches   Lower Trunk Rotation  10 seconds 10 reps to each side using red ball      Lumbar Exercises: Supine   Bridge  20 reps assist to keep legs  together, legs on red ball      Knee/Hip Exercises: Aerobic   Nustep  level 2x 10 minutes PT present to discuss progress      Knee/Hip Exercises: Seated   Sit to Sand  without UE support;10 reps;2 sets good control with stand to sit      Moist Heat Therapy   Number Minutes Moist Heat  20 Minutes    Moist Heat Location  Lumbar Spine during exercise      Manual Therapy   Passive ROM  Bil gastroc stretch, HS stretch, hip rotators, hip flexors 3x 20 sec holds ;  bilateral trunk and hip rotation and SKTC               PT Short Term Goals - 09/17/17 1636      PT SHORT TERM GOAL #1   Title  independent with initial HEP    Status  Achieved      PT SHORT  TERM GOAL #2   Title  TUG decreased to 16 sec or less due to improved strength and gait    Baseline  15.10 seconds    Status  Achieved      PT SHORT TERM GOAL #3   Title  sit to stand 19 seconds due to increased muscle control    Baseline  18.95 seconds    Time  4    Period  Weeks    Status  On-going        PT Long Term Goals - 08/21/17 1520      PT LONG TERM GOAL #1   Title  pt demonstrates TUG < or = to 13 sec for reduced risk of falls due to improved gait mechanics    Time  8    Period  Weeks    Status  New    Target Date  10/16/17      PT LONG TERM GOAL #2   Title  Pt demonstrates 5 x sit to stand < or = to 15 sec without plopping down into his chair due to improved strength and functional movements    Time  8    Period  Weeks    Status  New    Target Date  10/16/17      PT LONG TERM GOAL #3   Title  ambulate for 2 minutes without rest and demonstrate safety with this distance to improve short community ambulation    Time  8    Period  Weeks    Status  New    Target Date  10/16/17      PT LONG TERM GOAL #4   Title  independent with advanced HEP    Time  8    Period  Weeks    Status  New    Target Date  10/16/17            Plan - 09/17/17 1644    Clinical Impression Statement  Pt with  improved time with TUG today (15.10 seconds),  and 5x sit to stand was 18.95 seconds with good muscular control.  Pt is able to ambulate in the clinic with independence and required verbal cues for posture.  Pt with chonic stiffness and weakness due to CP.  Pt will benefit from skilled PT for flexibility, mobility and strnegth to improve safety.      Rehab Potential  Excellent    PT Frequency  2x / week    PT Duration  8 weeks    PT Treatment/Interventions  ADLs/Self Care Home Management;Biofeedback;Cryotherapy;Electrical Stimulation;Moist Heat;Ultrasound;Gait training;Stair training;Functional mobility training;Therapeutic activities;Therapeutic exercise;Balance training;Neuromuscular re-education;Patient/family education;Passive range of motion;Dry needling;Taping;Manual techniques    PT Next Visit Plan  stretching of hamstring, postural control, balance exercises, modalities as needed for back pain. Pt is going to reduce frequency due to limited visits.      Consulted and Agree with Plan of Care  Patient       Patient will benefit from skilled therapeutic intervention in order to improve the following deficits and impairments:  Abnormal gait, Decreased balance, Decreased coordination, Decreased range of motion, Impaired flexibility, Difficulty walking, Decreased strength, Pain, Impaired tone  Visit Diagnosis: Other abnormalities of gait and mobility  Muscle weakness (generalized)  Cramp and spasm     Problem List There are no active problems to display for this patient.    Lorrene Reid, PT 09/17/17 5:02 PM  [facility] [address], [address] [place],  [place], [address] Phone: [phone]   Fax:  [phone]  Name: Eddie Valentine MRN: [id number] Date of Birth: 06-04-1991

## 2017-09-29 ENCOUNTER — Ambulatory Visit: Payer: Managed Care, Other (non HMO) | Attending: Family Medicine

## 2017-09-29 DIAGNOSIS — R252 Cramp and spasm: Secondary | ICD-10-CM | POA: Diagnosis present

## 2017-09-29 DIAGNOSIS — M6281 Muscle weakness (generalized): Secondary | ICD-10-CM

## 2017-09-29 DIAGNOSIS — R2689 Other abnormalities of gait and mobility: Secondary | ICD-10-CM | POA: Insufficient documentation

## 2017-09-29 NOTE — Therapy (Signed)
[facility] [address] [place], [place], [address] Phone: [phone]   Fax:  [phone]  Physical Therapy Treatment  Patient Details  Name: Eddie Valentine MRN: [id number] Date of Birth: Jul 10, 1991 Referring Provider: Jarrett Soho, PA   Encounter Date: 09/29/2017  PT End of Session - 09/29/17 1650    Visit Number  8    Date for PT Re-Evaluation  10/16/17    Authorization Type  cigna    Authorization - Visit Number  8    Authorization - Number of Visits  30    PT Start Time  1619    PT Stop Time  1658    PT Time Calculation (min)  39 min    Activity Tolerance  Patient tolerated treatment well    Behavior During Therapy  [facility] for tasks assessed/performed       Past Medical History:  Diagnosis Date  . Cerebral palsy [facility])     Past Surgical History:  Procedure Laterality Date  . DEEP BRAIN STIMULATOR PLACEMENT      There were no vitals filed for this visit.  Subjective Assessment - 09/29/17 1620    Subjective  I fell in the shower last week.  My Rt shoulder is still bothering me.                        [facility] Adult PT Treatment/Exercise - 09/29/17 0001      Ambulation/Gait   Gait Comments  walking around the gym 6 laps- verbal cues for posture and speed      Lumbar Exercises: Stretches   Lower Trunk Rotation  10 seconds 10 reps to each side using red ball      Lumbar Exercises: Supine   Bridge  20 reps assist to keep legs together, legs on red ball      Knee/Hip Exercises: Aerobic   Nustep  level 2x 10 minutes PT present to discuss progress      Knee/Hip Exercises: Seated   Sit to Sand  without UE support;10 reps;2 sets good control with stand to sit      Moist Heat Therapy   Number Minutes Moist Heat  20 Minutes    Moist Heat Location  Lumbar Spine during exercise      Manual Therapy   Passive ROM  Bil gastroc stretch, HS stretch, hip rotators, hip flexors 3x 20 sec holds  ;  bilateral trunk and hip rotation and SKTC               PT Short Term Goals - 09/17/17 1636      PT SHORT TERM GOAL #1   Title  independent with initial HEP    Status  Achieved      PT SHORT TERM GOAL #2   Title  TUG decreased to 16 sec or less due to improved strength and gait    Baseline  15.10 seconds    Status  Achieved      PT SHORT TERM GOAL #3   Title  sit to stand 19 seconds due to increased muscle control    Baseline  18.95 seconds    Time  4    Period  Weeks    Status  On-going        PT Long Term Goals - 08/21/17 1520      PT LONG TERM GOAL #1   Title  pt demonstrates TUG < or = to 13 sec for  reduced risk of falls due to improved gait mechanics    Time  8    Period  Weeks    Status  New    Target Date  10/16/17      PT LONG TERM GOAL #2   Title  Pt demonstrates 5 x sit to stand < or = to 15 sec without plopping down into his chair due to improved strength and functional movements    Time  8    Period  Weeks    Status  New    Target Date  10/16/17      PT LONG TERM GOAL #3   Title  ambulate for 2 minutes without rest and demonstrate safety with this distance to improve short community ambulation    Time  8    Period  Weeks    Status  New    Target Date  10/16/17      PT LONG TERM GOAL #4   Title  independent with advanced HEP    Time  8    Period  Weeks    Status  New    Target Date  10/16/17            Plan - 09/29/17 1623    Clinical Impression Statement  Pt with lapse in treatment since 09/17/17.  Pt had a fall in the shower last week.  Pt with increased LE spasiticity/stiffness today due to not being stretched x 2 weeks.  Pt with increased endurance for walking today . Pt requires verbal cues and supervision for safety. Pt will continue to benefit from skilled PT due to chronic condition and instability.      Rehab Potential  Excellent    PT Frequency  2x / week    PT Duration  8 weeks    PT Treatment/Interventions  ADLs/Self  Care Home Management;Biofeedback;Cryotherapy;Electrical Stimulation;Moist Heat;Ultrasound;Gait training;Stair training;Functional mobility training;Therapeutic activities;Therapeutic exercise;Balance training;Neuromuscular re-education;Patient/family education;Passive range of motion;Dry needling;Taping;Manual techniques    PT Next Visit Plan  stretching of hamstring, postural control, balance exercises, modalities as needed for back pain. Pt is going to reduce frequency due to limited visits.      Recommended Other Services  initial certification is signed    Consulted and Agree with Plan of Care  Patient       Patient will benefit from skilled therapeutic intervention in order to improve the following deficits and impairments:  Abnormal gait, Decreased balance, Decreased coordination, Decreased range of motion, Impaired flexibility, Difficulty walking, Decreased strength, Pain, Impaired tone  Visit Diagnosis: Other abnormalities of gait and mobility  Muscle weakness (generalized)  Cramp and spasm     Problem List There are no active problems to display for this patient.    Lorrene ReidKelly Jeramy Dimmick, PT 09/29/17 4:52 PM   [facility] [address], [address] [place], [place], [address] Phone: [phone]   Fax:  [phone]  Name: Eddie CullMichael Valentine MRN: [id number] Date of Birth: [birth year]

## 2017-10-13 ENCOUNTER — Ambulatory Visit: Payer: Managed Care, Other (non HMO)

## 2017-10-15 ENCOUNTER — Ambulatory Visit: Payer: Managed Care, Other (non HMO)

## 2017-10-15 DIAGNOSIS — R2689 Other abnormalities of gait and mobility: Secondary | ICD-10-CM | POA: Diagnosis not present

## 2017-10-15 DIAGNOSIS — R252 Cramp and spasm: Secondary | ICD-10-CM

## 2017-10-15 DIAGNOSIS — M6281 Muscle weakness (generalized): Secondary | ICD-10-CM

## 2017-10-15 NOTE — Therapy (Signed)
[facility] [address], [address] [place], [place], [address] Phone: [phone]   Fax:  [phone]  Physical Therapy Treatment  Patient Details  Name: Eddie CullMichael Barca MRN: [id number] Date of Birth: 29-Sep-1990 Referring Provider: Jarrett Sohoourtney Wharton, PA   Encounter Date: 10/15/2017  PT End of Session - 10/15/17 1658    Visit Number  9    Date for PT Re-Evaluation  12/10/17    Authorization Type  cigna    Authorization - Visit Number  9    Authorization - Number of Visits  30    PT Start Time  1610    PT Stop Time  1658    PT Time Calculation (min)  48 min    Activity Tolerance  Patient tolerated treatment well    Behavior During Therapy  [facility] for tasks assessed/performed       Past Medical History:  Diagnosis Date  . Cerebral palsy [facility](HCC)     Past Surgical History:  Procedure Laterality Date  . DEEP BRAIN STIMULATOR PLACEMENT      There were no vitals filed for this visit.  Subjective Assessment - 10/15/17 1616    Subjective  Lapse in treatment x 2 weeks as pt is spacing out his PT.  I am walking good today.      Pain Score  5     Pain Location  Back    Pain Orientation  Lower    Pain Descriptors / Indicators  Aching;Tender    Pain Type  Chronic pain    Pain Onset  More than a month ago    Pain Frequency  Constant    Aggravating Factors   constant    Pain Relieving Factors  stretching, heat         [facility] PT Assessment - 10/15/17 0001      Assessment   Medical Diagnosis  M62.838 - cervical paraspinal muscle spasms      Prior Function   Level of Independence  Independent      Cognition   Overall Cognitive Status  Within Functional Limits for tasks assessed      Observation/Other Assessments   Observations  flexion throughout upper and lower extrmemities due to muscle spasms      Transfers   Five time sit to stand comments   18 seconds      Timed Up and Go Test   TUG  Normal TUG    Normal TUG  (seconds)  19.2                  [facility] Adult PT Treatment/Exercise - 10/15/17 0001      Ambulation/Gait   Gait Comments  walking around the gym 10 laps- verbal cues for posture and speed and clearance of Lt toe      Lumbar Exercises: Stretches   Lower Trunk Rotation  10 seconds 10 reps to each side using red ball      Lumbar Exercises: Supine   Bridge  20 reps assist to keep legs together, legs on red ball      Knee/Hip Exercises: Aerobic   Nustep  level 2x 10 minutes PT present to discuss progress      Knee/Hip Exercises: Seated   Sit to Sand  without UE support;10 reps;2 sets good control with stand to sit      Manual Therapy   Passive ROM  Bil gastroc stretch, HS stretch, hip rotators, hip flexors 3x 20 sec holds ;  bilateral trunk  and hip rotation and SKTC               PT Short Term Goals - 09/17/17 1636      PT SHORT TERM GOAL #1   Title  independent with initial HEP    Status  Achieved      PT SHORT TERM GOAL #2   Title  TUG decreased to 16 sec or less due to improved strength and gait    Baseline  15.10 seconds    Status  Achieved      PT SHORT TERM GOAL #3   Title  sit to stand 19 seconds due to increased muscle control    Baseline  18.95 seconds    Time  4    Period  Weeks    Status  On-going        PT Long Term Goals - 10/15/17 1621      PT LONG TERM GOAL #1   Title  pt demonstrates TUG < or = to 13 sec for reduced risk of falls due to improved gait mechanics    Baseline  18 seconds    Time  8    Period  Weeks    Status  On-going    Target Date  12/10/17      PT LONG TERM GOAL #2   Title  Pt demonstrates 5 x sit to stand < or = to 15 sec without plopping down into his chair due to improved strength and functional movements    Baseline  18 seconds    Time  8    Period  Weeks    Status  On-going    Target Date  12/10/17      PT LONG TERM GOAL #3   Title  ambulate for 2 minutes without rest and demonstrate safety with this  distance to improve short community ambulation    Baseline  walked 10 laps around gym (800 ft) in 6 minutes- some episodes of instability    Time  8    Period  Weeks    Status  Achieved      PT LONG TERM GOAL #4   Title  independent with advanced HEP    Baseline  pt is not consistent with HEP    Time  8    Period  Weeks    Status  On-going    Target Date  12/10/17      PT LONG TERM GOAL #5   Title  ambulate 880 feet in 6 minutes to improve endurance     Time  8    Period  Weeks    Status  New    Target Date  12/10/17            Plan - 10/15/17 1630    Clinical Impression Statement  Pt has reduced his frequency with PT due to limited PT sessions with insurance.  Pt with chronic spasticity/stiffness and gait abnormality due to CP.  PT with improved posture with standing and walking.  Pt performs sit to stand with improved control.  TUG is 19 seconds and 5x sit to stand is 18 seconds.  Pt is at a falls risk and had 1 fall since the start of care that contributed to slow progress with PT.  Pt walked 10 laps (800 ft) in 6 minutes today.  Pt requires verbal cues and supervision with exercise in the clinic today.  Pt with improved LE flexibility with stretching in the clinic.  Pt  will continue to benefit from skilled PT for gait, postural and core strength, endurance and passive stretching.      Rehab Potential  Excellent    Clinical Impairments Affecting Rehab Potential  CP    PT Frequency  1x / week    PT Duration  8 weeks    PT Treatment/Interventions  ADLs/Self Care Home Management;Biofeedback;Cryotherapy;Electrical Stimulation;Moist Heat;Ultrasound;Gait training;Stair training;Functional mobility training;Therapeutic activities;Therapeutic exercise;Balance training;Neuromuscular re-education;Patient/family education;Passive range of motion;Dry needling;Taping;Manual techniques    PT Next Visit Plan  stretching of hamstring, postural control, balance exercises, modalities as needed  for back pain. Pt is going to reduce frequency due to limited visits.      Recommended Other Services  initial certification is signed, recert sent 10/15/17    Consulted and Agree with Plan of Care  Patient       Patient will benefit from skilled therapeutic intervention in order to improve the following deficits and impairments:  Abnormal gait, Decreased balance, Decreased coordination, Decreased range of motion, Impaired flexibility, Difficulty walking, Decreased strength, Pain, Impaired tone  Visit Diagnosis: Other abnormalities of gait and mobility - Plan: PT plan of care cert/re-cert  Muscle weakness (generalized) - Plan: PT plan of care cert/re-cert  Cramp and spasm - Plan: PT plan of care cert/re-cert     Problem List There are no active problems to display for this patient.   Lorrene Reid, PT 10/15/17 5:00 PM  [facility] [address], [address] [place], [place], [address] Phone: [phone]   Fax:  [phone]  Name: Laroy Mustard MRN: [id number] Date of Birth: May 09, 1991

## 2017-10-22 ENCOUNTER — Encounter (HOSPITAL_COMMUNITY): Payer: Self-pay | Admitting: *Deleted

## 2017-10-22 ENCOUNTER — Other Ambulatory Visit: Payer: Self-pay

## 2017-10-22 ENCOUNTER — Emergency Department (HOSPITAL_COMMUNITY): Payer: Managed Care, Other (non HMO)

## 2017-10-22 ENCOUNTER — Emergency Department (HOSPITAL_COMMUNITY)
Admission: EM | Admit: 2017-10-22 | Discharge: 2017-10-22 | Disposition: A | Discharge status: Home / Self Care | Payer: Managed Care, Other (non HMO) | Attending: Emergency Medicine | Admitting: Emergency Medicine

## 2017-10-22 DIAGNOSIS — Y999 Unspecified external cause status: Secondary | ICD-10-CM | POA: Diagnosis not present

## 2017-10-22 DIAGNOSIS — M79602 Pain in left arm: Secondary | ICD-10-CM

## 2017-10-22 DIAGNOSIS — Z79899 Other long term (current) drug therapy: Secondary | ICD-10-CM | POA: Insufficient documentation

## 2017-10-22 DIAGNOSIS — S8992XA Unspecified injury of left lower leg, initial encounter: Secondary | ICD-10-CM | POA: Diagnosis present

## 2017-10-22 DIAGNOSIS — M25511 Pain in right shoulder: Secondary | ICD-10-CM | POA: Diagnosis not present

## 2017-10-22 DIAGNOSIS — G809 Cerebral palsy, unspecified: Secondary | ICD-10-CM | POA: Diagnosis not present

## 2017-10-22 DIAGNOSIS — M25562 Pain in left knee: Secondary | ICD-10-CM | POA: Diagnosis not present

## 2017-10-22 DIAGNOSIS — M25561 Pain in right knee: Secondary | ICD-10-CM | POA: Insufficient documentation

## 2017-10-22 DIAGNOSIS — Y9389 Activity, other specified: Secondary | ICD-10-CM | POA: Insufficient documentation

## 2017-10-22 DIAGNOSIS — M546 Pain in thoracic spine: Secondary | ICD-10-CM | POA: Diagnosis not present

## 2017-10-22 DIAGNOSIS — Y92480 Sidewalk as the place of occurrence of the external cause: Secondary | ICD-10-CM | POA: Diagnosis not present

## 2017-10-22 DIAGNOSIS — Z87891 Personal history of nicotine dependence: Secondary | ICD-10-CM | POA: Insufficient documentation

## 2017-10-22 DIAGNOSIS — W19XXXA Unspecified fall, initial encounter: Secondary | ICD-10-CM

## 2017-10-22 DIAGNOSIS — M549 Dorsalgia, unspecified: Secondary | ICD-10-CM

## 2017-10-22 MED ORDER — ACETAMINOPHEN 325 MG PO TABS: 650.0000 mg | ORAL_TABLET | Freq: Four times a day (QID) | ORAL | 0 refills | Status: DC | PRN

## 2017-10-22 NOTE — ED Notes (Signed)
Bed: WA03 Expected date:  Expected time:  Means of arrival:  Comments: 

## 2017-10-22 NOTE — Discharge Instructions (Addendum)
You may take over-the-counter anti-inflammatories to help ease your  muscle pain.  You can also take your regularly prescribed muscle relaxers for your symptoms. You may also use warm and cold compresses to help ease your symptoms.  You will need to follow-up with your regular doctor within 5-7 days for reevaluation.  Please also follow-up with your neurologist for reevaluation.  Return to the emergency department if you have any new or worsening symptoms.
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
## 2017-10-29 ENCOUNTER — Ambulatory Visit: Payer: Managed Care, Other (non HMO) | Attending: Family Medicine

## 2017-10-29 DIAGNOSIS — M6281 Muscle weakness (generalized): Secondary | ICD-10-CM | POA: Insufficient documentation

## 2017-10-29 DIAGNOSIS — R2689 Other abnormalities of gait and mobility: Secondary | ICD-10-CM | POA: Diagnosis present

## 2017-10-29 DIAGNOSIS — R252 Cramp and spasm: Secondary | ICD-10-CM | POA: Insufficient documentation
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
## 2017-11-26 ENCOUNTER — Ambulatory Visit: Payer: Managed Care, Other (non HMO) | Attending: Family Medicine

## 2017-11-26 DIAGNOSIS — M6281 Muscle weakness (generalized): Secondary | ICD-10-CM | POA: Diagnosis present

## 2017-11-26 DIAGNOSIS — R252 Cramp and spasm: Secondary | ICD-10-CM | POA: Diagnosis present

## 2017-11-26 DIAGNOSIS — R2689 Other abnormalities of gait and mobility: Secondary | ICD-10-CM

## 2017-11-26 NOTE — Therapy (Signed)
[facility] [address], [address] [place], [place], [address] Phone: [phone]   Fax:  [phone]  Physical Therapy Treatment  Patient Details  Name: Eddie Valentine MRN: [id number] Date of Birth: 06/07/1991 Referring Provider: Jarrett Soho, PA   Encounter Date: 11/26/2017  PT End of Session - 11/26/17 1649    Visit Number  11    Date for PT Re-Evaluation  12/10/17    Authorization Type  cigna    Authorization - Visit Number  11    Authorization - Number of Visits  30    PT Start Time  1603    PT Stop Time  1650    PT Time Calculation (min)  47 min    Activity Tolerance  Patient tolerated treatment well    Behavior During Therapy  [facility] for tasks assessed/performed       Past Medical History:  Diagnosis Date  . Cerebral palsy [facility])     Past Surgical History:  Procedure Laterality Date  . DEEP BRAIN STIMULATOR PLACEMENT      There were no vitals filed for this visit.  Subjective Assessment - 11/26/17 1620    Subjective  Lapse in treatment.  Had to cancel last visit due to illness and only comes every other week.      Currently in Pain?  Yes    Pain Score  5     Pain Location  Back    Pain Orientation  Lower    Pain Descriptors / Indicators  Aching    Pain Type  Chronic pain    Pain Onset  More than a month ago    Pain Frequency  Constant    Aggravating Factors   constant    Pain Relieving Factors  heat, stretching         [facility] PT Assessment - 11/26/17 0001      6 minute walk test results    Aerobic Endurance Distance Walked  880    Endurance additional comments  verbal cues for control                   [facility] Adult PT Treatment/Exercise - 11/26/17 0001      Transfers   Five time sit to stand comments   16 seconds reduced eccentric control      Ambulation/Gait   Gait Comments  walking around the gym 11 laps- verbal cues for posture and speed and clearance of Lt toe performed in 6  minutes      Lumbar Exercises: Stretches   Lower Trunk Rotation  10 seconds 10 reps to each side using red ball      Lumbar Exercises: Supine   Bridge  20 reps assist to keep legs together, legs on red ball      Knee/Hip Exercises: Aerobic   Nustep  level 2x 13 minutes PT present to discuss progress      Knee/Hip Exercises: Seated   Sit to Sand  without UE support;10 reps;2 sets good control with stand to sit      Manual Therapy   Passive ROM  Bil gastroc stretch, HS stretch, hip rotators, hip flexors 3x 20 sec holds ;  bilateral trunk and hip rotation and SKTC               PT Short Term Goals - 09/17/17 1636      PT SHORT TERM GOAL #1   Title  independent with initial HEP    Status  Achieved      PT SHORT TERM GOAL #2   Title  TUG decreased to 16 sec or less due to improved strength and gait    Baseline  15.10 seconds    Status  Achieved      PT SHORT TERM GOAL #3   Title  sit to stand 19 seconds due to increased muscle control    Baseline  18.95 seconds    Time  4    Period  Weeks    Status  On-going        PT Long Term Goals - 11/26/17 1621      PT LONG TERM GOAL #1   Title  pt demonstrates TUG < or = to 13 sec for reduced risk of falls due to improved gait mechanics      PT LONG TERM GOAL #2   Title  Pt demonstrates 5 x sit to stand < or = to 15 sec without plopping down into his chair due to improved strength and functional movements    Baseline  16 seconds with reduced eccentric controls    Time  8    Period  Weeks    Status  On-going      PT LONG TERM GOAL #4   Title  independent with advanced HEP    Baseline  pt is not consistent with HEP    Time  8    Period  Weeks    Status  On-going      PT LONG TERM GOAL #5   Title  ambulate 880 feet in 6 minutes to improve endurance     Baseline  880 feet today    Status  Achieved            Plan - 11/26/17 1627    Clinical Impression Statement  Pt with decreased frequency of treatment due  to limited visits and pt had to cancel last session due to illness.  Pt reports high levels of stress and was advised to do stress relieving activities such as medication or deep breathing.  Pt with reduced eccentric control with sit to stand today and perfomred in 16 seconds.  Pt had difficulty staying focused during session and required verbal cues for correct technique with exercise.  Pt will continue to benefit from skilled PT due to chronic nature of condition.      Rehab Potential  Excellent    PT Frequency  1x / week    PT Duration  8 weeks    PT Treatment/Interventions  ADLs/Self Care Home Management;Biofeedback;Cryotherapy;Electrical Stimulation;Moist Heat;Ultrasound;Gait training;Stair training;Functional mobility training;Therapeutic activities;Therapeutic exercise;Balance training;Neuromuscular re-education;Patient/family education;Passive range of motion;Dry needling;Taping;Manual techniques    PT Next Visit Plan  stretching of hamstring, postural control, balance exercises, modalities as needed for back pain. Pt is reducing frequency due to limited visits.  reassessment next session.      Recommended Other Services  all certs signed    Consulted and Agree with Plan of Care  Patient       Patient will benefit from skilled therapeutic intervention in order to improve the following deficits and impairments:  Abnormal gait, Decreased balance, Decreased coordination, Decreased range of motion, Impaired flexibility, Difficulty walking, Decreased strength, Pain, Impaired tone  Visit Diagnosis: Other abnormalities of gait and mobility  Muscle weakness (generalized)  Cramp and spasm     Problem List There are no active problems to display for this patient.   Lorrene ReidKelly Takacs, PT 11/26/17 4:51 PM  [facility] [address] [place], [place], [address] Phone: [phone]   Fax:  [phone]  Name: Eddie Valentine MRN:  [id number] Date of Birth: [birth year]

## 2017-12-03 ENCOUNTER — Ambulatory Visit: Payer: Managed Care, Other (non HMO)

## 2017-12-03 DIAGNOSIS — M6281 Muscle weakness (generalized): Secondary | ICD-10-CM

## 2017-12-03 DIAGNOSIS — R2689 Other abnormalities of gait and mobility: Secondary | ICD-10-CM | POA: Diagnosis not present

## 2017-12-03 DIAGNOSIS — R252 Cramp and spasm: Secondary | ICD-10-CM

## 2017-12-03 NOTE — Therapy (Addendum)
[facility] [address] [place], [place], [address] Phone: [phone]   Fax:  [phone]  Physical Therapy Treatment  Patient Details  Name: Eddie Valentine MRN: [id number] Date of Birth: 12/11/1990 Referring Provider: Marda Stalker, PA   Encounter Date: 12/03/2017  PT End of Session - 12/03/17 1610    Visit Number  12    Date for PT Re-Evaluation  02/25/18    Authorization Type  cigna    Authorization - Visit Number  12    Authorization - Number of Visits  30    PT Start Time  2297    PT Stop Time  9892    PT Time Calculation (min)  46 min    Activity Tolerance  Patient tolerated treatment well    Behavior During Therapy  [facility] for tasks assessed/performed       Past Medical History:  Diagnosis Date  . Cerebral palsy [facility])     Past Surgical History:  Procedure Laterality Date  . DEEP BRAIN STIMULATOR PLACEMENT      There were no vitals filed for this visit.  Subjective Assessment - 12/03/17 1529    Subjective  I am doing good.  I havent had any falls.  I am walking much better.  The stretching helps.      Currently in Pain?  Yes    Pain Score  8     Pain Location  Back    Pain Orientation  Lower    Pain Descriptors / Indicators  Aching    Pain Type  Chronic pain    Pain Onset  More than a month ago    Pain Frequency  Constant    Aggravating Factors   constant    Pain Relieving Factors  heat, stretching         [facility] PT Assessment - 12/03/17 0001      Assessment   Medical Diagnosis  M62.838 - cervical paraspinal muscle spasms      Prior Function   Level of Independence  Independent      Cognition   Overall Cognitive Status  Within Functional Limits for tasks assessed      Posture/Postural Control   Posture/Postural Control  Postural limitations    Postural Limitations  Weight shift right    Posture Comments  trunk extension, increased WB on right side in standing      ROM /  Strength   AROM / PROM / Strength  AROM;Strength;PROM      AROM   Overall AROM   --      PROM   Overall PROM   Deficits      Timed Up and Go Test   TUG  Normal TUG    Normal TUG (seconds)  14                   [facility] Adult PT Treatment/Exercise - 12/03/17 0001      Transfers   Five time sit to stand comments   14 seconds      Ambulation/Gait   Gait Comments  walking around the gym 12 laps +30 ft- verbal cues for posture and speed and clearance of Lt toe performed in 6 minutes      Lumbar Exercises: Stretches   Lower Trunk Rotation  10 seconds 10 reps to each side using red ball      Lumbar Exercises: Supine   Bridge  20 reps assist to keep legs together, legs on red  ball      Knee/Hip Exercises: Aerobic   Nustep  level 2 x 10 minutes PT present to discuss progress      Knee/Hip Exercises: Seated   Sit to Sand  without UE support;10 reps;2 sets good control with stand to sit      Manual Therapy   Passive ROM  Bil gastroc stretch, HS stretch, hip rotators, hip flexors 3x 20 sec holds ;  bilateral trunk and hip rotation and SKTC               PT Short Term Goals - 09/17/17 1636      PT SHORT TERM GOAL #1   Title  independent with initial HEP    Status  Achieved      PT SHORT TERM GOAL #2   Title  TUG decreased to 16 sec or less due to improved strength and gait    Baseline  15.10 seconds    Status  Achieved      PT SHORT TERM GOAL #3   Title  sit to stand 19 seconds due to increased muscle control    Baseline  18.95 seconds    Time  4    Period  Weeks    Status  On-going        PT Long Term Goals - 12/03/17 1533      PT LONG TERM GOAL #1   Title  pt demonstrates TUG < or = to 13 sec for reduced risk of falls due to improved gait mechanics    Baseline  14 seconds    Time  12    Period  Weeks    Status  On-going    Target Date  02/25/18      PT LONG TERM GOAL #2   Title  Pt demonstrates 5 x sit to stand < or = to 13 sec without  plopping down into his chair due to improved strength and functional movements    Baseline  14 seconds with moderate control    Time  12    Period  Weeks    Status  Revised    Target Date  02/25/18      PT LONG TERM GOAL #3   Title  ambulate for 8 minutes without rest and demonstrate safety with this distance to improve short community ambulation    Baseline  6 minutes- fatigue    Time  12    Period  Weeks    Status  Revised    Target Date  02/25/18      PT LONG TERM GOAL #4   Title  independent with advanced HEP    Baseline  pt is not consistent with HEP    Time  12    Period  Weeks    Status  On-going    Target Date  02/25/18      PT LONG TERM GOAL #5   Title  ambulate 1200 feet in 6 minutes to improve endurance     Baseline  990 feet today    Time  8    Period  Weeks    Status  Revised    Target Date  02/25/18            Plan - 12/03/17 1551    Clinical Impression Statement  Pt is making steady progress with PT.  Pt walked 990 feet in 6 minutes today with significant fatigue with this today. Pt with improved posture and control with gait.  Pt  requires verbal cues for control at times and step height to reduce tripping. Pt improved TUG to 14 seconds.  Pt with LE spasticity due to CP and has improved mobility and stability with regular stretching.  Pt is coming biweekly due to limit on insurance visit.  Pt will continue to benefit from skilled PT due to chronic condition to improve safety, mobility and endurance to allow for independence with community activity.      Rehab Potential  Good    PT Frequency  Biweekly    PT Duration  12 weeks    PT Treatment/Interventions  ADLs/Self Care Home Management;Biofeedback;Cryotherapy;Electrical Stimulation;Moist Heat;Ultrasound;Gait training;Stair training;Functional mobility training;Therapeutic activities;Therapeutic exercise;Balance training;Neuromuscular re-education;Patient/family education;Passive range of motion;Dry  needling;Taping;Manual techniques    PT Next Visit Plan  stretching of hamstring, postural control, balance exercises, modalities as needed for back pain. Pt is reducing frequency due to limited visits.         Patient will benefit from skilled therapeutic intervention in order to improve the following deficits and impairments:  Abnormal gait, Decreased balance, Decreased coordination, Decreased range of motion, Impaired flexibility, Difficulty walking, Decreased strength, Pain, Impaired tone  Visit Diagnosis: Other abnormalities of gait and mobility - Plan: PT plan of care cert/re-cert  Muscle weakness (generalized) - Plan: PT plan of care cert/re-cert  Cramp and spasm - Plan: PT plan of care cert/re-cert     Problem List There are no active problems to display for this patient.    Sigurd Sos, PT 12/03/17 4:12 PM PHYSICAL THERAPY DISCHARGE SUMMARY  Visits from Start of Care: 12  Current functional level related to goals / functional outcomes: Pt canceled many of his final PT appointments and called in to request D/C from PT at this time.    Remaining deficits:  See above for most current deficits.     Education / Equipment: HEP Plan: Patient agrees to discharge.  Patient goals were partially met. Patient is being discharged due to the patient's request.  ?????        Sigurd Sos, PT 01/27/18 8:15 AM  [facility] [address] [place], [place], [address] Phone: [phone]   Fax:  [phone]  Name: Eddie Valentine MRN: [id number] Date of Birth: 1991/06/27

## 2018-01-14 ENCOUNTER — Ambulatory Visit: Payer: Managed Care, Other (non HMO)

## 2018-03-28 ENCOUNTER — Other Ambulatory Visit: Payer: Self-pay

## 2018-03-28 ENCOUNTER — Encounter (HOSPITAL_COMMUNITY): Payer: Self-pay | Admitting: *Deleted

## 2018-03-28 ENCOUNTER — Inpatient Hospital Stay (HOSPITAL_COMMUNITY)
Admission: EM | Admit: 2018-03-28 | Discharge: 2018-03-31 | DRG: 558 | Disposition: A | Discharge status: Home / Self Care | Payer: Managed Care, Other (non HMO) | Attending: Internal Medicine | Admitting: Internal Medicine

## 2018-03-28 DIAGNOSIS — Z87891 Personal history of nicotine dependence: Secondary | ICD-10-CM

## 2018-03-28 DIAGNOSIS — W228XXA Striking against or struck by other objects, initial encounter: Secondary | ICD-10-CM | POA: Diagnosis present

## 2018-03-28 DIAGNOSIS — M7022 Olecranon bursitis, left elbow: Secondary | ICD-10-CM | POA: Diagnosis present

## 2018-03-28 DIAGNOSIS — F419 Anxiety disorder, unspecified: Secondary | ICD-10-CM | POA: Diagnosis present

## 2018-03-28 DIAGNOSIS — R39198 Other difficulties with micturition: Secondary | ICD-10-CM | POA: Diagnosis present

## 2018-03-28 DIAGNOSIS — L03114 Cellulitis of left upper limb: Secondary | ICD-10-CM | POA: Diagnosis present

## 2018-03-28 DIAGNOSIS — G809 Cerebral palsy, unspecified: Secondary | ICD-10-CM | POA: Diagnosis present

## 2018-03-28 DIAGNOSIS — Z79899 Other long term (current) drug therapy: Secondary | ICD-10-CM

## 2018-03-28 DIAGNOSIS — S50312A Abrasion of left elbow, initial encounter: Secondary | ICD-10-CM | POA: Diagnosis present

## 2018-03-28 DIAGNOSIS — Z888 Allergy status to other drugs, medicaments and biological substances status: Secondary | ICD-10-CM

## 2018-03-28 NOTE — ED Triage Notes (Signed)
Pt reports he was on abx for left elbow laceration and now the redness is spread. Pt is on abx from his PCP.

## 2018-03-29 ENCOUNTER — Other Ambulatory Visit: Payer: Self-pay

## 2018-03-29 ENCOUNTER — Encounter (HOSPITAL_COMMUNITY): Payer: Self-pay | Admitting: Internal Medicine

## 2018-03-29 ENCOUNTER — Emergency Department (HOSPITAL_COMMUNITY): Payer: Managed Care, Other (non HMO)

## 2018-03-29 DIAGNOSIS — Z87891 Personal history of nicotine dependence: Secondary | ICD-10-CM | POA: Diagnosis not present

## 2018-03-29 DIAGNOSIS — S50312A Abrasion of left elbow, initial encounter: Secondary | ICD-10-CM | POA: Diagnosis present

## 2018-03-29 DIAGNOSIS — Z888 Allergy status to other drugs, medicaments and biological substances status: Secondary | ICD-10-CM | POA: Diagnosis not present

## 2018-03-29 DIAGNOSIS — F419 Anxiety disorder, unspecified: Secondary | ICD-10-CM | POA: Diagnosis present

## 2018-03-29 DIAGNOSIS — G809 Cerebral palsy, unspecified: Secondary | ICD-10-CM | POA: Diagnosis present

## 2018-03-29 DIAGNOSIS — R39198 Other difficulties with micturition: Secondary | ICD-10-CM | POA: Diagnosis present

## 2018-03-29 DIAGNOSIS — L03114 Cellulitis of left upper limb: Secondary | ICD-10-CM | POA: Diagnosis present

## 2018-03-29 DIAGNOSIS — Z79899 Other long term (current) drug therapy: Secondary | ICD-10-CM | POA: Diagnosis not present

## 2018-03-29 DIAGNOSIS — R338 Other retention of urine: Secondary | ICD-10-CM | POA: Diagnosis not present

## 2018-03-29 DIAGNOSIS — M7022 Olecranon bursitis, left elbow: Secondary | ICD-10-CM | POA: Diagnosis present

## 2018-03-29 DIAGNOSIS — W228XXA Striking against or struck by other objects, initial encounter: Secondary | ICD-10-CM | POA: Diagnosis present

## 2018-03-29 LAB — CBC WITH DIFFERENTIAL/PLATELET
Basophils Absolute: 0 10*3/uL (ref 0.0–0.1)
Basophils Relative: 0 %
Eosinophils Absolute: 0.1 10*3/uL (ref 0.0–0.7)
Eosinophils Relative: 1 %
HCT: 42.4 % (ref 39.0–52.0)
Hemoglobin: 14.7 g/dL (ref 13.0–17.0)
Lymphocytes Relative: 17 %
Lymphs Abs: 1.7 10*3/uL (ref 0.7–4.0)
MCH: 32.2 pg (ref 26.0–34.0)
MCHC: 34.7 g/dL (ref 30.0–36.0)
MCV: 93 fL (ref 78.0–100.0)
Monocytes Absolute: 0.7 10*3/uL (ref 0.1–1.0)
Monocytes Relative: 7 %
Neutro Abs: 7.4 10*3/uL (ref 1.7–7.7)
Neutrophils Relative %: 75 %
Platelets: 285 10*3/uL (ref 150–400)
RBC: 4.56 MIL/uL (ref 4.22–5.81)
RDW: 12.8 % (ref 11.5–15.5)
WBC: 9.9 10*3/uL (ref 4.0–10.5)

## 2018-03-29 LAB — COMPREHENSIVE METABOLIC PANEL
ALT: 26 U/L (ref 0–44)
AST: 34 U/L (ref 15–41)
Albumin: 4.2 g/dL (ref 3.5–5.0)
Alkaline Phosphatase: 61 U/L (ref 38–126)
Anion gap: 9 (ref 5–15)
BUN: 26 mg/dL — ABNORMAL HIGH (ref 6–20)
CO2: 30 mmol/L (ref 22–32)
Calcium: 9.8 mg/dL (ref 8.9–10.3)
Chloride: 106 mmol/L (ref 98–111)
Creatinine, Ser: 0.85 mg/dL (ref 0.61–1.24)
GFR calc Af Amer: >60 mL/min (ref >60)
GFR calc non Af Amer: >60 mL/min (ref >60)
Glucose, Bld: 93 mg/dL (ref 70–99)
Potassium: 4.8 mmol/L (ref 3.5–5.1)
Sodium: 145 mmol/L (ref 135–145)
Total Bilirubin: 1 mg/dL (ref 0.3–1.2)
Total Protein: 7.9 g/dL (ref 6.5–8.1)

## 2018-03-29 LAB — URINALYSIS, ROUTINE W REFLEX MICROSCOPIC
Bilirubin Urine: NEGATIVE
Glucose, UA: NEGATIVE mg/dL
Hgb urine dipstick: NEGATIVE
Ketones, ur: 5 mg/dL — AB
Leukocytes, UA: NEGATIVE
Nitrite: NEGATIVE
Protein, ur: NEGATIVE mg/dL
Specific Gravity, Urine: 1.023 (ref 1.005–1.030)
pH: 6 (ref 5.0–8.0)

## 2018-03-29 LAB — HIV ANTIBODY (ROUTINE TESTING W REFLEX): HIV Screen 4th Generation wRfx: NONREACTIVE

## 2018-03-29 LAB — I-STAT CG4 LACTIC ACID, ED: Lactic Acid, Venous: 1.18 mmol/L (ref 0.5–1.9)

## 2018-03-29 MED ORDER — ONDANSETRON HCL 4 MG/2ML IJ SOLN: 4.0000 mg | Freq: Four times a day (QID) | INTRAMUSCULAR | Status: DC | PRN

## 2018-03-29 MED ORDER — ACETAMINOPHEN 325 MG PO TABS: 650.0000 mg | ORAL_TABLET | Freq: Four times a day (QID) | ORAL | Status: DC | PRN

## 2018-03-29 MED ORDER — ACETAMINOPHEN 650 MG RE SUPP: 650.0000 mg | Freq: Four times a day (QID) | RECTAL | Status: DC | PRN

## 2018-03-29 MED ORDER — SODIUM CHLORIDE 0.9 % IV SOLN
INTRAVENOUS | Status: DC
  Administered 2018-03-29 – 2018-03-31 (×4): via INTRAVENOUS

## 2018-03-29 MED ORDER — ADULT MULTIVITAMIN W/MINERALS CH
1.0000 | ORAL_TABLET | Freq: Every day | ORAL | Status: DC
  Administered 2018-03-29 – 2018-03-30 (×2): 1 via ORAL
  Filled 2018-03-29 (×2): qty 1

## 2018-03-29 MED ORDER — ONDANSETRON HCL 4 MG PO TABS
4.0000 mg | ORAL_TABLET | Freq: Four times a day (QID) | ORAL | Status: DC | PRN
  Administered 2018-03-31: 4 mg via ORAL
  Filled 2018-03-29: qty 1

## 2018-03-29 MED ORDER — VANCOMYCIN HCL IN DEXTROSE 750-5 MG/150ML-% IV SOLN
750.0000 mg | Freq: Two times a day (BID) | INTRAVENOUS | Status: DC
  Administered 2018-03-29 – 2018-03-30 (×3): 750 mg via INTRAVENOUS
  Filled 2018-03-29 (×3): qty 150

## 2018-03-29 MED ORDER — VANCOMYCIN HCL IN DEXTROSE 1-5 GM/200ML-% IV SOLN
1000.0000 mg | Freq: Once | INTRAVENOUS | Status: AC
  Administered 2018-03-29: 1000 mg via INTRAVENOUS
  Filled 2018-03-29: qty 200

## 2018-03-29 MED ORDER — TAMSULOSIN HCL 0.4 MG PO CAPS
0.4000 mg | ORAL_CAPSULE | Freq: Every day | ORAL | Status: DC
  Administered 2018-03-30 – 2018-03-31 (×2): 0.4 mg via ORAL
  Filled 2018-03-29 (×3): qty 1

## 2018-03-29 MED ORDER — ENOXAPARIN SODIUM 40 MG/0.4ML ~~LOC~~ SOLN
40.0000 mg | Freq: Every day | SUBCUTANEOUS | Status: DC
  Administered 2018-03-29 – 2018-03-31 (×3): 40 mg via SUBCUTANEOUS
  Filled 2018-03-29 (×3): qty 0.4

## 2018-03-29 MED ORDER — TIZANIDINE HCL 4 MG PO TABS
2.0000 mg | ORAL_TABLET | Freq: Three times a day (TID) | ORAL | Status: DC
  Administered 2018-03-29 – 2018-03-31 (×6): 2 mg via ORAL
  Filled 2018-03-29 (×7): qty 1

## 2018-03-29 MED ORDER — CLONAZEPAM 0.5 MG PO TABS: 0.5000 mg | ORAL_TABLET | Freq: Two times a day (BID) | ORAL | Status: DC | PRN

## 2018-03-29 MED ORDER — NAPROXEN SODIUM 275 MG PO TABS
275.0000 mg | ORAL_TABLET | Freq: Two times a day (BID) | ORAL | Status: DC | PRN
  Filled 2018-03-29: qty 1

## 2018-03-29 MED ORDER — CLONAZEPAM 0.5 MG PO TABS
0.5000 mg | ORAL_TABLET | Freq: Two times a day (BID) | ORAL | Status: DC
  Administered 2018-03-29 – 2018-03-31 (×7): 0.5 mg via ORAL
  Filled 2018-03-29 (×8): qty 1

## 2018-03-29 NOTE — Progress Notes (Signed)
Pharmacy Antibiotic Note  Gershon CullMichael Gullick is a 27 y.o. male admitted on 03/28/2018 with cellulitis of the left elbow.  Pharmacy has been consulted for vancomycin dosing.  Plan: Vancomycin 1 Gm x1 then 750 mg IV q12h for est AUC = 450 Goal AUC = 400-500 F/u scr/cultures/levels     Temp (24hrs), Avg:98.8 F (37.1 C), Min:98.1 F (36.7 C), Max:100 F (37.8 C)  Recent Labs  Lab 03/29/18 0133 03/29/18 0142  WBC 9.9  --   CREATININE 0.85  --   LATICACIDVEN  --  1.18    CrCl cannot be calculated (Unknown ideal weight.).    Allergies  Allergen Reactions  . Oxybutynin Other (See Comments)    hallucinations      Antimicrobials this admission: 8/11 vancomycin >>    >>   Dose adjustments this admission:   Microbiology results:  BCx:   UCx:    Sputum:    MRSA PCR:   Thank you for allowing pharmacy to be a part of this patient's care.  Lorenza EvangelistGreen, Chasten Blaze R 03/29/2018 4:18 AM

## 2018-03-29 NOTE — ED Notes (Signed)
Pt denies N/V or fever.

## 2018-03-29 NOTE — ED Provider Notes (Signed)
[facility]-EMERGENCY DEPT Provider Note   CSN: [id number] Arrival date & time: 03/28/18  2303     History   Chief Complaint Chief Complaint  Patient presents with  . Wound Check    HPI Eddie Valentine is a 27 y.o. male with past medical history of cerebral palsy, who presents today for evaluation of left elbow pain.  He reports that a few weeks ago he was getting off a plane when he hit his left elbow.  This area started getting red and he was started on doxycycline by his PCP on Thursday.  He reports that initially it was getting better, however when he woke up this morning he had redness spreading away from the joint.  He is able to bend and flex the arm about the same as usual, however says that it hurts when he attempts to fully flex it.  He lives independently and has a job.  He does not self cath and urinates normally.   HPI  Past Medical History:  Diagnosis Date  . Cerebral palsy [facility])     Patient Active Problem List   Diagnosis Date Noted  . Cellulitis of left elbow 03/29/2018  . Olecranon bursitis of left elbow 03/29/2018    Past Surgical History:  Procedure Laterality Date  . DEEP BRAIN STIMULATOR PLACEMENT          Home Medications    Prior to Admission medications   Medication Sig Start Date End Date Taking? Authorizing Provider  clonazePAM (KLONOPIN) 0.5 MG tablet Take 0.5 mg by mouth 2 (two) times daily as needed for anxiety.   Yes [provider]  Multiple Vitamin (MULTIVITAMIN WITH MINERALS) TABS tablet Take 1 tablet by mouth daily.   Yes [provider]  naproxen sodium (ALEVE) 220 MG tablet Take 220 mg by mouth 2 (two) times daily as needed (pain).   Yes [provider]  tiZANidine (ZANAFLEX) 2 MG tablet Take 2 mg by mouth 3 (three) times daily.    Yes [provider]    Family History Family History  Problem Relation Age of Onset  . Other Father        Father recently died of recurrent  MRSA infections apparently    Social History Social History   Tobacco Use  . Smoking status: Former Games developer  . Smokeless tobacco: Never Used  Substance Use Topics  . Alcohol use: No  . Drug use: No     Allergies   Oxybutynin   Review of Systems Review of Systems  Constitutional: Negative for chills and fever.  HENT: Negative for congestion and sore throat.   Respiratory: Negative for chest tightness and shortness of breath.   Cardiovascular: Negative for chest pain.  Gastrointestinal: Negative for abdominal pain, diarrhea, nausea and vomiting.  Skin: Positive for color change and wound.  Neurological: Negative for weakness.  All other systems reviewed and are negative.    Physical Exam Updated Vital Signs BP (!) 146/87   Pulse 78   Temp 98.2 F (36.8 C) (Oral)   Resp 20   Ht 5\' 6"  (1.676 m)   Wt 51.5 kg   SpO2 99%   BMI 18.33 kg/m   Physical Exam  Constitutional: He appears well-nourished. No distress.  Thin  HENT:  Head: Normocephalic and atraumatic.  Mouth/Throat: Oropharynx is clear and moist.  Eyes: Conjunctivae are normal. Right eye exhibits no discharge. Left eye exhibits no discharge. No scleral icterus.  Neck: Normal range of motion.  Cardiovascular:  Normal rate, regular rhythm and intact distal pulses.  Pulmonary/Chest: Effort normal. No stridor. No respiratory distress.  Abdominal: He exhibits no distension.  Musculoskeletal: He exhibits no deformity.  Left elbow has redness over the olecranon.  There is diffuse less prominent erythema extending midway down and up the arm.  He is able to flex and extend the arm.  Neurological: He is alert. He exhibits abnormal muscle tone (Increased tone).  Skin: Skin is warm and dry. He is not diaphoretic.  Psychiatric: He has a normal mood and affect. His behavior is normal.  Nursing note and vitals reviewed.        ED Treatments / Results  Labs (all labs ordered are listed, but only abnormal results  are displayed) Labs Reviewed  COMPREHENSIVE METABOLIC PANEL - Abnormal; Notable for the following components:      Result Value   BUN 26 (*)    All other components within normal limits  CULTURE, BLOOD (ROUTINE X 2)  CULTURE, BLOOD (ROUTINE X 2)  CBC WITH DIFFERENTIAL/PLATELET  HIV ANTIBODY (ROUTINE TESTING)  URINALYSIS, ROUTINE W REFLEX MICROSCOPIC  I-STAT CG4 LACTIC ACID, ED    EKG None  Radiology Dg Elbow Complete Left  Result Date: 03/29/2018 CLINICAL DATA:  Erythema of the left elbow. Patient has been on antibiotics for left elbow laceration with spreading of the erythema. EXAM: LEFT ELBOW - COMPLETE 3+ VIEW COMPARISON:  None. FINDINGS: Soft tissue swelling of the elbow along the dorsum and radial aspect. No soft tissue emphysema. No acute osseous abnormality. No joint effusion, bone destruction or malalignment of the left elbow is visualized. IMPRESSION: Soft tissue swelling compatible with cellulitis. No acute osseous abnormality. Electronically Signed   By: Tollie Eth M.D.   On: 03/29/2018 01:37    Procedures Procedures (including critical care time)  Medications Ordered in ED Medications  0.9 %  sodium chloride infusion ( Intravenous New Bag/Given 03/29/18 0430)  vancomycin (VANCOCIN) IVPB 1000 mg/200 mL premix (1,000 mg Intravenous New Bag/Given 03/29/18 0140)     Initial Impression / Assessment and Plan / ED Course  I have reviewed the triage vital signs and the nursing notes.  Pertinent labs & imaging results that were available during my care of the patient were reviewed by me and considered in my medical decision making (see chart for details).  Clinical Course as of Mar 29 501  Sun Mar 29, 2018  0157 Temp: 100 F (37.8 C) [EH]  [phone] Spoke with Dr. Julian Reil who will come see patient.    [EH]    Clinical Course User Index [EH] Norman Clay   [name] presents today for evaluation of redness around his left elbow.  He has been taking  doxycycline as an outpatient for this and reports that it initially got better, however this morning he noticed that it was spreading about halfway down his lower arm and halfway up his upper arm from the elbow.  X-rays were obtained and reviewed.  Labs are obtained, white count is not elevated, CMP without significant abnormalities.  He is not tachycardic or tachypneic and is afebrile, does not meet sepsis criteria.  However as patient's symptoms have worsened despite being on appropriate antibiotic therapy this represents a failure of outpatient treatment.  Discussed admission with the patient who reluctantly agrees.  Hospitalist Dr. Julian Reil was consulted who agreed to admit the patient.  Final Clinical Impressions(s) / ED Diagnoses   Final diagnoses:  Cellulitis of left upper extremity    ED  Discharge Orders    None       Norman ClayHammond, Janiel Derhammer W, PA-C 03/29/18 [facility] [id number]    Azalia Bilisampos, Kevin, MD 03/29/18 [phone]

## 2018-03-29 NOTE — ED Notes (Signed)
ED TO INPATIENT HANDOFF REPORT  Name/Age/Gender Eddie Valentine 27 y.o. male  Code Status    Code Status Orders  (From admission, onward)         Start     Ordered   03/29/18 0251  Full code  Continuous     03/29/18 0255        Code Status History    This patient has a current code status but no historical code status.      Home/SNF/Other Home  Chief Complaint left elbow injury  Level of Care/Admitting Diagnosis ED Disposition    ED Disposition Condition Comment   Admit  Hospital Area: [facility] [[id number]]  Level of Care: Med-Surg [16]  Diagnosis: Cellulitis of left elbow [5329924]  Admitting Physician: Etta Quill [[id number]]  Attending Physician: Etta Quill [[id number]]  Estimated length of stay: past midnight tomorrow  Certification:: I certify this patient will need inpatient services for at least 2 midnights  PT Class (Do Not Modify): Inpatient [101]  PT Acc Code (Do Not Modify): Private [1]       Medical History Past Medical History:  Diagnosis Date  . Cerebral palsy (HCC)     Allergies Allergies  Allergen Reactions  . Oxybutynin Other (See Comments)    hallucinations      IV Location/Drains/Wounds Patient Lines/Drains/Airways Status   Active Line/Drains/Airways    Name:   Placement date:   Placement time:   Site:   Days:   Peripheral IV 03/29/18 Right Forearm   03/29/18    0139    Forearm   less than 1          Labs/Imaging Results for orders placed or performed during the hospital encounter of 03/28/18 (from the past 48 hour(s))  Comprehensive metabolic panel     Status: Abnormal   Collection Time: 03/29/18  1:33 AM  Result Value Ref Range   Sodium 145 135 - 145 mmol/L   Potassium 4.8 3.5 - 5.1 mmol/L   Chloride 106 98 - 111 mmol/L   CO2 30 22 - 32 mmol/L   Glucose, Bld 93 70 - 99 mg/dL   BUN 26 (H) 6 - 20 mg/dL   Creatinine, Ser 0.85 0.61 - 1.24 mg/dL   Calcium 9.8 8.9 - 10.3 mg/dL   Total Protein 7.9 6.5 -  8.1 g/dL   Albumin 4.2 3.5 - 5.0 g/dL   AST 34 15 - 41 U/L   ALT 26 0 - 44 U/L   Alkaline Phosphatase 61 38 - 126 U/L   Total Bilirubin 1.0 0.3 - 1.2 mg/dL   GFR calc non Af Amer >60 >60 mL/min   GFR calc Af Amer >60 >60 mL/min    Comment: (NOTE) The eGFR has been calculated using the CKD EPI equation. This calculation has not been validated in all clinical situations. eGFR's persistently <60 mL/min signify possible Chronic Kidney Disease.    Anion gap 9 5 - 15    Comment: Performed at [facility], [place] [address] [place], [place] [address]  CBC with Differential     Status: None   Collection Time: 03/29/18  1:33 AM  Result Value Ref Range   WBC 9.9 4.0 - 10.5 K/uL   RBC 4.56 4.22 - 5.81 MIL/uL   Hemoglobin 14.7 13.0 - 17.0 g/dL   HCT 42.4 39.0 - 52.0 %   MCV 93.0 78.0 - 100.0 fL   MCH 32.2 26.0 - 34.0 pg   MCHC 34.7  30.0 - 36.0 g/dL   RDW 12.8 11.5 - 15.5 %   Platelets 285 150 - 400 K/uL   Neutrophils Relative % 75 %   Neutro Abs 7.4 1.7 - 7.7 K/uL   Lymphocytes Relative 17 %   Lymphs Abs 1.7 0.7 - 4.0 K/uL   Monocytes Relative 7 %   Monocytes Absolute 0.7 0.1 - 1.0 K/uL   Eosinophils Relative 1 %   Eosinophils Absolute 0.1 0.0 - 0.7 K/uL   Basophils Relative 0 %   Basophils Absolute 0.0 0.0 - 0.1 K/uL    Comment: Performed at [facility], [place] [address], [place], [place] [address]  I-Stat CG4 Lactic Acid, ED     Status: None   Collection Time: 03/29/18  1:42 AM  Result Value Ref Range   Lactic Acid, Venous 1.18 0.5 - 1.9 mmol/L   Dg Elbow Complete Left  Result Date: 03/29/2018 CLINICAL DATA:  Erythema of the left elbow. Patient has been on antibiotics for left elbow laceration with spreading of the erythema. EXAM: LEFT ELBOW - COMPLETE 3+ VIEW COMPARISON:  None. FINDINGS: Soft tissue swelling of the elbow along the dorsum and radial aspect. No soft tissue emphysema. No acute osseous abnormality. No joint effusion, bone  destruction or malalignment of the left elbow is visualized. IMPRESSION: Soft tissue swelling compatible with cellulitis. No acute osseous abnormality. Electronically Signed   By: Ashley Royalty M.D.   On: 03/29/2018 01:37    Pending Labs Unresulted Labs (From admission, onward)    Start     Ordered   03/29/18 0308  Urinalysis, Routine w reflex microscopic  Once,   R     03/29/18 0307   03/29/18 0250  HIV antibody (Routine Testing)  Once,   R     03/29/18 0255   03/29/18 0046  Culture, blood (routine x 2)  BLOOD CULTURE X 2,   STAT     03/29/18 0046          Vitals/Pain Today's Vitals   03/29/18 0145 03/29/18 0146 03/29/18 0245 03/29/18 0300  BP: (!) 149/84  (!) 108/57 (!) 130/93  Pulse: 66  73   Resp: 20     Temp:  100 F (37.8 C)    TempSrc:  Rectal    SpO2: 99%  99%   PainSc:        Isolation Precautions No active isolations  Medications Medications  tiZANidine (ZANAFLEX) tablet 2 mg (has no administration in time range)  naproxen sodium (ANAPROX) tablet 275 mg (has no administration in time range)  multivitamin with minerals tablet 1 tablet (has no administration in time range)  acetaminophen (TYLENOL) tablet 650 mg (has no administration in time range)    Or  acetaminophen (TYLENOL) suppository 650 mg (has no administration in time range)  ondansetron (ZOFRAN) tablet 4 mg (has no administration in time range)    Or  ondansetron (ZOFRAN) injection 4 mg (has no administration in time range)  enoxaparin (LOVENOX) injection 40 mg (has no administration in time range)  clonazePAM (KLONOPIN) tablet 0.5 mg (has no administration in time range)  0.9 %  sodium chloride infusion (has no administration in time range)  vancomycin (VANCOCIN) IVPB 1000 mg/200 mL premix (1,000 mg Intravenous New Bag/Given 03/29/18 0140)    Mobility power wheelchair

## 2018-03-29 NOTE — H&P (Signed)
History and Physical    Eddie CullMichael Pedretti ZOX:[id number] DOB: 1991/01/06 DOA: 03/28/2018  PCP: Clayborn Heronankins, Victoria R, MD  Patient coming from: Home  I have personally briefly reviewed patient's old medical records in [facility]  Chief Complaint: Cellulitis of L elbow  HPI: Eddie CullMichael Aime is a 27 y.o. male with medical history significant of Cerebral Palsy.  Patient got abrasion on L elbow a few weeks ago while getting off of a plane, still has abrasion / scab.  Area began turning red and started on doxycycline by PCP on Thursday.  Initially this was improving, until it suddenly became worse starting earlier today with redness especially over the back of the elbow.  Is able to bend and flex the arm about the same as usual, does hurt when he attempts to fully flex it.   ED Course: Put on empiric vanc.  Tm 100.0   Review of Systems: As per HPI otherwise 10 point review of systems negative.   Past Medical History:  Diagnosis Date  . Cerebral palsy [facility](HCC)     Past Surgical History:  Procedure Laterality Date  . DEEP BRAIN STIMULATOR PLACEMENT       reports that he has quit smoking. He has never used smokeless tobacco. He reports that he does not drink alcohol or use drugs.  Allergies  Allergen Reactions  . Oxybutynin Other (See Comments)    hallucinations      Family History  Problem Relation Age of Onset  . Other Father        Father recently died of recurrent MRSA infections apparently     Prior to Admission medications   Medication Sig Start Date End Date Taking? Authorizing Provider  clonazePAM (KLONOPIN) 0.5 MG tablet Take 0.5 mg by mouth 2 (two) times daily as needed for anxiety.   Yes [provider]  Multiple Vitamin (MULTIVITAMIN WITH MINERALS) TABS tablet Take 1 tablet by mouth daily.   Yes [provider]  naproxen sodium (ALEVE) 220 MG tablet Take 220 mg by mouth 2 (two) times daily as needed (pain).   Yes [provider]    tiZANidine (ZANAFLEX) 2 MG tablet Take 2 mg by mouth 3 (three) times daily.    Yes [provider]    Physical Exam: Vitals:   03/29/18 0145 03/29/18 0146 03/29/18 0245 03/29/18 0300  BP: (!) 149/84  (!) 108/57 (!) 130/93  Pulse: 66  73   Resp: 20     Temp:  100 F (37.8 C)    TempSrc:  Rectal    SpO2: 99%  99%     Constitutional: NAD, calm, comfortable Eyes: PERRL, lids and conjunctivae normal ENMT: Mucous membranes are moist. Posterior pharynx clear of any exudate or lesions.Normal dentition.  Neck: normal, supple, no masses, no thyromegaly Respiratory: clear to auscultation bilaterally, no wheezing, no crackles. Normal respiratory effort. No accessory muscle use.  Cardiovascular: Regular rate and rhythm, no murmurs / rubs / gallops. No extremity edema. 2+ pedal pulses. No carotid bruits.  Abdomen: no tenderness, no masses palpated. No hepatosplenomegaly. Bowel sounds positive.  Musculoskeletal: no clubbing / cyanosis. No joint deformity upper and lower extremities. Good ROM, no contractures. Normal muscle tone.  Skin:   Neurologic: MAE, increased muscle tone Psychiatric: Normal judgment and insight. Alert and oriented x 3. Normal mood.    Labs on Admission: I have personally reviewed following labs and imaging studies  CBC: Recent Labs  Lab 03/29/18 0133  WBC 9.9  NEUTROABS 7.4  HGB  14.7  HCT 42.4  MCV 93.0  PLT 285   Basic Metabolic Panel: Recent Labs  Lab 03/29/18 0133  NA 145  K 4.8  CL 106  CO2 30  GLUCOSE 93  BUN 26*  CREATININE 0.85  CALCIUM 9.8   GFR: CrCl cannot be calculated (Unknown ideal weight.). Liver Function Tests: Recent Labs  Lab 03/29/18 0133  AST 34  ALT 26  ALKPHOS 61  BILITOT 1.0  PROT 7.9  ALBUMIN 4.2   No results for input(s): LIPASE, AMYLASE in the last 168 hours. No results for input(s): AMMONIA in the last 168 hours. Coagulation Profile: No results for input(s): INR, PROTIME in the last 168 hours. Cardiac  Enzymes: No results for input(s): CKTOTAL, CKMB, CKMBINDEX, TROPONINI in the last 168 hours. BNP (last 3 results) No results for input(s): PROBNP in the last 8760 hours. HbA1C: No results for input(s): HGBA1C in the last 72 hours. CBG: No results for input(s): GLUCAP in the last 168 hours. Lipid Profile: No results for input(s): CHOL, HDL, LDLCALC, TRIG, CHOLHDL, LDLDIRECT in the last 72 hours. Thyroid Function Tests: No results for input(s): TSH, T4TOTAL, FREET4, T3FREE, THYROIDAB in the last 72 hours. Anemia Panel: No results for input(s): VITAMINB12, FOLATE, FERRITIN, TIBC, IRON, RETICCTPCT in the last 72 hours. Urine analysis: No results found for: COLORURINE, APPEARANCEUR, LABSPEC, PHURINE, GLUCOSEU, HGBUR, BILIRUBINUR, KETONESUR, PROTEINUR, UROBILINOGEN, NITRITE, LEUKOCYTESUR  Radiological Exams on Admission: Dg Elbow Complete Left  Result Date: 03/29/2018 CLINICAL DATA:  Erythema of the left elbow. Patient has been on antibiotics for left elbow laceration with spreading of the erythema. EXAM: LEFT ELBOW - COMPLETE 3+ VIEW COMPARISON:  None. FINDINGS: Soft tissue swelling of the elbow along the dorsum and radial aspect. No soft tissue emphysema. No acute osseous abnormality. No joint effusion, bone destruction or malalignment of the left elbow is visualized. IMPRESSION: Soft tissue swelling compatible with cellulitis. No acute osseous abnormality. Electronically Signed   By: Tollie Eth M.D.   On: 03/29/2018 01:37    EKG: Independently reviewed.  Assessment/Plan Principal Problem:   Olecranon bursitis of left elbow Active Problems:   Cellulitis of left elbow    1. Cellulitis of L elbow - likely olecranon bursitis as well 1. Failed outpt doxy 2. Put on IV vanc - note also fhx / contacts with prior MRSA (father). 3. BCx pending 4. Call Ortho in AM for eval and likely aspiration / cultures 2. Anxiety - 1. Continue Klonapin BID scheduled and may take a 3rd time  PRN. 3. Difficulty with urination today - 1. Will check post-void bladder scan 2. No h/o neurogenic bladder 3. Check UA as well 4. Monitor intake and output  DVT prophylaxis: Lovenox Code Status: Full Family Communication: Mother and Step-father at bedside, step-father is a physician Consulting civil engineer). Disposition Plan: Home after admit Consults called: None, call ortho in AM Admission status: Admit to inpatient   Hillary Bow DO [facility] Pager [phone] Only works nights!  If 7AM-7PM, please contact the primary day team physician taking care of patient  [email or web address] Password [email or web address]  03/29/2018, 3:44 AM

## 2018-03-29 NOTE — Progress Notes (Signed)
PROGRESS NOTE    Eddie Valentine  UJW:[id number] DOB: Nov 05, 1990 DOA: 03/28/2018 PCP: Clayborn Heron, MD   Brief Narrative:  HPI on 03/29/2018 by Dr. Lyda Perone Eddie Valentine is a 27 y.o. male with medical history significant of Cerebral Palsy.  Patient got abrasion on L elbow a few weeks ago while getting off of a plane, still has abrasion / scab.  Area began turning red and started on doxycycline by PCP on Thursday.  Initially this was improving, until it suddenly became worse starting earlier today with redness especially over the back of the elbow.  Is able to bend and flex the arm about the same as usual, does hurt when he attempts to fully flex it. Assessment & Plan   Admitted earlier today by Dr. Otho Darner, see H&P for full details.  Cellulitis of the left elbow, concern for olecranon bursitis -Patient failed outpatient doxycycline -Currently afebrile with no leukocytosis -Currently on vancomycin -Blood cultures currently pending -Orthopedic surgery consulted and appreciated  Anxiety -Continue Klonopin  Difficulty with urination -Transient, no history of neurogenic bladder -Monitor intake and output  History of cerebral palsy -Stable  DVT Prophylaxis Lovenox  Code Status: Full  Family Communication: None at bedside  Disposition Plan: Admitted.  Pending orthopedic consultation and improvement in cellulitis  Consultants Orthopedic surgery  Procedures  None  Antibiotics   Anti-infectives (From admission, onward)   Start     Dose/Rate Route Frequency Ordered Stop   03/29/18 0800  vancomycin (VANCOCIN) IVPB 750 mg/150 ml premix     750 mg 150 mL/hr over 60 Minutes Intravenous Every 12 hours 03/29/18 0517     03/29/18 0100  vancomycin (VANCOCIN) IVPB 1000 mg/200 mL premix     1,000 mg 200 mL/hr over 60 Minutes Intravenous  Once 03/29/18 0046 03/29/18 0240      Subjective:   Eddie Valentine seen and examined today.  Continues to complain of pain  in the left elbow and states it hurts when it is touched.  Denies current chest pain, shortness of breath, abdominal pain, nausea or vomiting, diarrhea constipation, headache or dizziness.    Objective:   Vitals:   03/29/18 0245 03/29/18 0300 03/29/18 0357 03/29/18 0459  BP: (!) 108/57 (!) 130/93 (!) 146/87   Pulse: 73  78   Resp:      Temp:   98.2 F (36.8 C)   TempSrc:   Oral   SpO2: 99%  99%   Weight:    51.5 kg  Height:    5\' 6"  (1.676 m)    Intake/Output Summary (Last 24 hours) at 03/29/2018 1126 Last data filed at 03/29/2018 0820 Gross per 24 hour  Intake 258.57 ml  Output -  Net 258.57 ml   Filed Weights   03/29/18 0459  Weight: 51.5 kg    Exam  General: Well developed, well nourished, NAD, appears stated age  HEENT: NCAT,  mucous membranes moist.   Neck: Supple  Cardiovascular: S1 S2 auscultated, no rubs, murmurs or gallops. Regular rate and rhythm.  Respiratory: Clear to auscultation bilaterally with equal chest rise  Abdomen: Soft, nontender, nondistended, + bowel sounds  Extremities: warm dry without cyanosis clubbing or edema.   Neuro: AAOx3, nonfocal  Skin: Erythema surrounding the left elbow extending to the forearm  Psych: appropriate mood and affect   Data Reviewed: I have personally reviewed following labs and imaging studies  CBC: Recent Labs  Lab 03/29/18 0133  WBC 9.9  NEUTROABS 7.4  HGB 14.7  HCT 42.4  MCV 93.0  PLT 285   Basic Metabolic Panel: Recent Labs  Lab 03/29/18 0133  NA 145  K 4.8  CL 106  CO2 30  GLUCOSE 93  BUN 26*  CREATININE 0.85  CALCIUM 9.8   GFR: Estimated Creatinine Clearance: 93.4 mL/min (by C-G formula based on SCr of 0.85 mg/dL). Liver Function Tests: Recent Labs  Lab 03/29/18 0133  AST 34  ALT 26  ALKPHOS 61  BILITOT 1.0  PROT 7.9  ALBUMIN 4.2   No results for input(s): LIPASE, AMYLASE in the last 168 hours. No results for input(s): AMMONIA in the last 168 hours. Coagulation  Profile: No results for input(s): INR, PROTIME in the last 168 hours. Cardiac Enzymes: No results for input(s): CKTOTAL, CKMB, CKMBINDEX, TROPONINI in the last 168 hours. BNP (last 3 results) No results for input(s): PROBNP in the last 8760 hours. HbA1C: No results for input(s): HGBA1C in the last 72 hours. CBG: No results for input(s): GLUCAP in the last 168 hours. Lipid Profile: No results for input(s): CHOL, HDL, LDLCALC, TRIG, CHOLHDL, LDLDIRECT in the last 72 hours. Thyroid Function Tests: No results for input(s): TSH, T4TOTAL, FREET4, T3FREE, THYROIDAB in the last 72 hours. Anemia Panel: No results for input(s): VITAMINB12, FOLATE, FERRITIN, TIBC, IRON, RETICCTPCT in the last 72 hours. Urine analysis: No results found for: COLORURINE, APPEARANCEUR, LABSPEC, PHURINE, GLUCOSEU, HGBUR, BILIRUBINUR, KETONESUR, PROTEINUR, UROBILINOGEN, NITRITE, LEUKOCYTESUR Sepsis Labs: @LABRCNTIP (procalcitonin:4,lacticidven:4)  )No results found for this or any previous visit (from the past 240 hour(s)).    Radiology Studies: Dg Elbow Complete Left  Result Date: 03/29/2018 CLINICAL DATA:  Erythema of the left elbow. Patient has been on antibiotics for left elbow laceration with spreading of the erythema. EXAM: LEFT ELBOW - COMPLETE 3+ VIEW COMPARISON:  None. FINDINGS: Soft tissue swelling of the elbow along the dorsum and radial aspect. No soft tissue emphysema. No acute osseous abnormality. No joint effusion, bone destruction or malalignment of the left elbow is visualized. IMPRESSION: Soft tissue swelling compatible with cellulitis. No acute osseous abnormality. Electronically Signed   By: Tollie Ethavid  Kwon M.D.   On: 03/29/2018 01:37     Scheduled Meds: . clonazePAM  0.5 mg Oral BID  . enoxaparin (LOVENOX) injection  40 mg Subcutaneous Daily  . multivitamin with minerals  1 tablet Oral Daily  . tiZANidine  2 mg Oral TID   Continuous Infusions: . sodium chloride 100 mL/hr at 03/29/18 0600  .  vancomycin 750 mg (03/29/18 0821)     LOS: 0 days   Time Spent in minutes   30 minutes  [name] D.O. on 03/29/2018 at 11:26 AM  Between 7am to 7pm - Pager - [phone]  After 7pm go to [email or web address] - password [email or web address]  And look for the night coverage person covering for me after hours  [facility] Office  [phone]

## 2018-03-29 NOTE — Consult Note (Signed)
Reason for Consult: Left elbow redness Referring Physician: Hospitalist  Jeromey Kruer is an 27 y.o. male.  HPI: The patient is a 27 year old male with cerebral palsy who was admitted to the hospital with erythema and pain over his left elbow.  He has been on IV antibiotic's were couple of days and we are consulted for evaluation of left elbow redness.  The patient has not had problems like this in the past.  He does have some fair amount of issues with spasticity and he does spend a fair amount of time in a motorized wheelchair.  Past Medical History:  Diagnosis Date  . Cerebral palsy [facility])     Past Surgical History:  Procedure Laterality Date  . DEEP BRAIN STIMULATOR PLACEMENT      Family History  Problem Relation Age of Onset  . Other Father        Father recently died of recurrent MRSA infections apparently    Social History:  reports that he has quit smoking. He has never used smokeless tobacco. He reports that he does not drink alcohol or use drugs.  Allergies:  Allergies  Allergen Reactions  . Oxybutynin Other (See Comments)    hallucinations      Medications: I have reviewed the patient's current medications.  Results for orders placed or performed during the hospital encounter of 03/28/18 (from the past 48 hour(s))  Comprehensive metabolic panel     Status: Abnormal   Collection Time: 03/29/18  1:33 AM  Result Value Ref Range   Sodium 145 135 - 145 mmol/L   Potassium 4.8 3.5 - 5.1 mmol/L   Chloride 106 98 - 111 mmol/L   CO2 30 22 - 32 mmol/L   Glucose, Bld 93 70 - 99 mg/dL   BUN 26 (H) 6 - 20 mg/dL   Creatinine, Ser 0.85 0.61 - 1.24 mg/dL   Calcium 9.8 8.9 - 10.3 mg/dL   Total Protein 7.9 6.5 - 8.1 g/dL   Albumin 4.2 3.5 - 5.0 g/dL   AST 34 15 - 41 U/L   ALT 26 0 - 44 U/L   Alkaline Phosphatase 61 38 - 126 U/L   Total Bilirubin 1.0 0.3 - 1.2 mg/dL   GFR calc non Af Amer >60 >60 mL/min   GFR calc Af Amer >60 >60 mL/min    Comment: (NOTE) The eGFR has  been calculated using the CKD EPI equation. This calculation has not been validated in all clinical situations. eGFR's persistently <60 mL/min signify possible Chronic Kidney Disease.    Anion gap 9 5 - 15    Comment: Performed at [facility], [place] [address] [place], [place] [address]  CBC with Differential     Status: None   Collection Time: 03/29/18  1:33 AM  Result Value Ref Range   WBC 9.9 4.0 - 10.5 K/uL   RBC 4.56 4.22 - 5.81 MIL/uL   Hemoglobin 14.7 13.0 - 17.0 g/dL   HCT 42.4 39.0 - 52.0 %   MCV 93.0 78.0 - 100.0 fL   MCH 32.2 26.0 - 34.0 pg   MCHC 34.7 30.0 - 36.0 g/dL   RDW 12.8 11.5 - 15.5 %   Platelets 285 150 - 400 K/uL   Neutrophils Relative % 75 %   Neutro Abs 7.4 1.7 - 7.7 K/uL   Lymphocytes Relative 17 %   Lymphs Abs 1.7 0.7 - 4.0 K/uL   Monocytes Relative 7 %   Monocytes Absolute 0.7 0.1 - 1.0 K/uL  Eosinophils Relative 1 %   Eosinophils Absolute 0.1 0.0 - 0.7 K/uL   Basophils Relative 0 %   Basophils Absolute 0.0 0.0 - 0.1 K/uL    Comment: Performed at [facility], [place] [address] [place], [place] [address]  I-Stat CG4 Lactic Acid, ED     Status: None   Collection Time: 03/29/18  1:42 AM  Result Value Ref Range   Lactic Acid, Venous 1.18 0.5 - 1.9 mmol/L    Dg Elbow Complete Left  Result Date: 03/29/2018 CLINICAL DATA:  Erythema of the left elbow. Patient has been on antibiotics for left elbow laceration with spreading of the erythema. EXAM: LEFT ELBOW - COMPLETE 3+ VIEW COMPARISON:  None. FINDINGS: Soft tissue swelling of the elbow along the dorsum and radial aspect. No soft tissue emphysema. No acute osseous abnormality. No joint effusion, bone destruction or malalignment of the left elbow is visualized. IMPRESSION: Soft tissue swelling compatible with cellulitis. No acute osseous abnormality. Electronically Signed   By: Ashley Royalty M.D.   On: 03/29/2018 01:37    ROS  ROS: I have reviewed the patient's review of  systems thoroughly and there are no positive responses as relates to the HPI. Blood pressure (!) 146/87, pulse 78, temperature 98.2 F (36.8 C), temperature source Oral, resp. rate 20, height _0  (1.676 m), weight 51.5 kg, SpO2 99 %. Physical Exam Well-developed well-nourished patient in no acute distress. Alert and oriented x3 HEENT:within normal limits Cardiac: Regular rate and rhythm Pulmonary: Lungs clear to auscultation Abdomen: Soft and nontender.  Normal active bowel sounds  Musculoskeletal: (Left elbow: Small amount of erythema over the olecranon.  There is no fluctuance.  There is some tenderness to palpation in that area.  Interestingly when the arm is straight the redness resolves completely. Assessment/Plan: 27 year old male with cerebral palsy who does put some pressure around the left elbow who is admitted with olecranon bursitis.//I had a discussion with the patient and his family today.  I do not think there is anything surgically to do for him.  I do not think there is fluctuance enough to put a needle in this area.  I do think that he needs antibiotics and at some point could convert to oral antibiotics.  I talked about putting a padded dressing over the elbow that might help with any pressure that he is putting on this area.  I also think he can benefit from use of a straight arm splint and I have asked the [facility] to come and do this.  It is unclear to me with his spasticity if that will become an issue but certainly can be taken off if it becomes more problematic.  I be happy to follow him through my office.  Alta Corning 03/29/2018, 11:49 AM

## 2018-03-30 DIAGNOSIS — G809 Cerebral palsy, unspecified: Secondary | ICD-10-CM

## 2018-03-30 DIAGNOSIS — R338 Other retention of urine: Secondary | ICD-10-CM

## 2018-03-30 LAB — BASIC METABOLIC PANEL
Anion gap: 8 (ref 5–15)
BUN: 15 mg/dL (ref 6–20)
CO2: 28 mmol/L (ref 22–32)
Calcium: 9.3 mg/dL (ref 8.9–10.3)
Chloride: 106 mmol/L (ref 98–111)
Creatinine, Ser: 0.71 mg/dL (ref 0.61–1.24)
GFR calc Af Amer: >60 mL/min (ref >60)
GFR calc non Af Amer: >60 mL/min (ref >60)
Glucose, Bld: 82 mg/dL (ref 70–99)
Potassium: 4.8 mmol/L (ref 3.5–5.1)
Sodium: 142 mmol/L (ref 135–145)

## 2018-03-30 LAB — CBC
HCT: 43.1 % (ref 39.0–52.0)
Hemoglobin: 14.9 g/dL (ref 13.0–17.0)
MCH: 32 pg (ref 26.0–34.0)
MCHC: 34.6 g/dL (ref 30.0–36.0)
MCV: 92.7 fL (ref 78.0–100.0)
Platelets: 289 10*3/uL (ref 150–400)
RBC: 4.65 MIL/uL (ref 4.22–5.81)
RDW: 12.5 % (ref 11.5–15.5)
WBC: 6.4 10*3/uL (ref 4.0–10.5)

## 2018-03-30 MED ORDER — ADULT MULTIVITAMIN W/MINERALS CH
1.0000 | ORAL_TABLET | Freq: Every day | ORAL | Status: DC
  Administered 2018-03-30: 1 via ORAL
  Filled 2018-03-30: qty 1

## 2018-03-30 MED ORDER — AMOXICILLIN 500 MG PO CAPS
500.0000 mg | ORAL_CAPSULE | Freq: Three times a day (TID) | ORAL | Status: DC
  Administered 2018-03-30 – 2018-03-31 (×4): 500 mg via ORAL
  Filled 2018-03-30 (×5): qty 1

## 2018-03-30 MED ORDER — BISACODYL 5 MG PO TBEC: 5.0000 mg | DELAYED_RELEASE_TABLET | Freq: Every day | ORAL | Status: DC | PRN

## 2018-03-30 MED ORDER — DOXYCYCLINE HYCLATE 100 MG PO TABS
100.0000 mg | ORAL_TABLET | Freq: Two times a day (BID) | ORAL | Status: DC
  Administered 2018-03-30 – 2018-03-31 (×3): 100 mg via ORAL
  Filled 2018-03-30 (×3): qty 1

## 2018-03-30 NOTE — Progress Notes (Signed)
Pharmacy Antibiotic Note  Eddie Valentine is a 27 y.o. male admitted on 03/28/2018 with cellulitis of the left elbow.  Pharmacy was consulted for vancomycin dosing.  Change to oral antibiotics today  Plan: Amoxicillin 500mg  po tid x 10 days Doxycycline 100mg  po bid x 10 days  Height: 5\' 6"  (167.6 cm) Weight: 113 lb 8.6 oz (51.5 kg) IBW/kg (Calculated) : 63.8  Temp (24hrs), Avg:97.7 F (36.5 C), Min:97.7 F (36.5 C), Max:97.7 F (36.5 C)  Recent Labs  Lab 03/29/18 0133 03/29/18 0142 03/30/18 0439  WBC 9.9  --  6.4  CREATININE 0.85  --  0.71  LATICACIDVEN  --  1.18  --     Estimated Creatinine Clearance: 99.2 mL/min (by C-G formula based on SCr of 0.71 mg/dL).    Allergies  Allergen Reactions  . Oxybutynin Other (See Comments)    hallucinations     Antimicrobials this admission: 8/11 vancomycin >> 8/12 8/12 Amoxicillin & Doxycycline po  >>   Microbiology results: 8/11 BCx: ngtd  Thank you for allowing pharmacy to be a part of this patient's care.  Otho BellowsGreen, Stacye Noori L 03/30/2018 9:20 AM

## 2018-03-30 NOTE — Progress Notes (Signed)
Subjective: Patient doing better today.  He is complaining of less pain and problems in the elbow.   Objective: Vital signs in last 24 hours: Temp:  [97.7 F (36.5 C)] 97.7 F (36.5 C) (08/12 0449) Pulse Rate:  [63-93] 63 (08/12 0449) Resp:  [16-20] 18 (08/12 0449) BP: (141-161)/(58-85) 154/85 (08/12 0449) SpO2:  [95 %-100 %] 100 % (08/12 0449)  Intake/Output from previous day: 08/11 0701 - 08/12 0700 In: 2477.9 [P.O.:240; I.V.:1937.8; IV Piggyback:300.1] Out: 1550 [Urine:1550] Intake/Output this shift: No intake/output data recorded.  Recent Labs    03/29/18 0133 03/30/18 0439  HGB 14.7 14.9   Recent Labs    03/29/18 0133 03/30/18 0439  WBC 9.9 6.4  RBC 4.56 4.65  HCT 42.4 43.1  PLT 285 289   Recent Labs    03/29/18 0133 03/30/18 0439  NA 145 142  K 4.8 4.8  CL 106 106  CO2 30 28  BUN 26* 15  CREATININE 0.85 0.71  GLUCOSE 93 82  CALCIUM 9.8 9.3   No results for input(s): LABPT, INR in the last 72 hours.  Neurologically intact ABD soft Neurovascular intact No cellulitis present Compartment soft      Assessment/Plan: 27 year old male with concerns for olecranon bursitis.  He is looking much better today after IV antibiotic treatment and placement of a bandage over his left elbow.  We attempted splinting but with his spasticity it was not able to be performed.  At this point I think he is doing well and I will plan to see him back in the office just as needed.  I would try to have him continue to keep a bandage over the elbow just to provide some trauma relief over the next couple weeks.   Harvie JuniorJohn L Elyn Krogh 03/30/2018, 8:28 AM

## 2018-03-30 NOTE — Discharge Instructions (Signed)
Try to keep a elbow pad it is best as possible.

## 2018-03-30 NOTE — Progress Notes (Signed)
PROGRESS NOTE    Eddie Valentine  ZOX:[id number] DOB: [birth year] DOA: 03/28/2018 PCP: Clayborn Heronankins, Victoria R, MD   Brief Narrative:  HPI on 03/29/2018 by Dr. Lyda PeroneJared Gardner Eddie Valentine is a 27 y.o. male with medical history significant of Cerebral Palsy.  Patient got abrasion on L elbow a few weeks ago while getting off of a plane, still has abrasion / scab.  Area began turning red and started on doxycycline by PCP on Thursday.  Initially this was improving, until it suddenly became worse starting earlier today with redness especially over the back of the elbow.  Is able to bend and flex the arm about the same as usual, does hurt when he attempts to fully flex it. Assessment & Plan   Cellulitis of the left elbow -concern for bursitis -Patient failed outpatient doxycycline -Currently afebrile with no leukocytosis -Currently on vancomycin, will transition to oral doxycycline and amoxicillin- will need treatment for 10 days -Blood cultures currently pending -Orthopedic surgery consulted and appreciated- did not feel there was a need for surgical intervention as there was not enough fluctuance in the area for aspiration. Recommended continuing antibiotics and using a padded dressing over the elbow to avoid pressure/trauma. May follow up in the office with Dr. Luiz BlareGraves as needed.  Anxiety -Continue Klonopin  Urinary rentention -Transient, no history of neurogenic bladder -patient had 491cc on bladder scan yesterday morning- placed foley catheter -started on flomax -will remove foley and attempt voiding trial  History of cerebral palsy -Stable  DVT Prophylaxis Lovenox  Code Status: Full  Family Communication: None at bedside; parents via phone  Disposition Plan: Admitted.  Pending blood culture results and voiding trial. Possible discharge to home on 8/13  Consultants Orthopedic surgery  Procedures  None  Antibiotics   Anti-infectives (From admission, onward)   Start      Dose/Rate Route Frequency Ordered Stop   03/30/18 1000  doxycycline (VIBRA-TABS) tablet 100 mg     100 mg Oral Every 12 hours 03/30/18 0919 04/09/18 0959   03/30/18 0930  amoxicillin (AMOXIL) capsule 500 mg     500 mg Oral Every 8 hours 03/30/18 0919 04/09/18 0559   03/29/18 0800  vancomycin (VANCOCIN) IVPB 750 mg/150 ml premix  Status:  Discontinued     750 mg 150 mL/hr over 60 Minutes Intravenous Every 12 hours 03/29/18 0517 03/30/18 0919   03/29/18 0100  vancomycin (VANCOCIN) IVPB 1000 mg/200 mL premix     1,000 mg 200 mL/hr over 60 Minutes Intravenous  Once 03/29/18 0046 03/29/18 0240      Subjective:   Eddie Valentine seen and examined today.  Feels pain in the left elbow has improved.  Denies current chest pain, shortness breath, abdominal pain, nausea or vomiting, diarrhea, dizziness or headache.  Does feel that he needs to have a bowel movement today.  Objective:   Vitals:   03/29/18 0459 03/29/18 1348 03/29/18 2102 03/30/18 0449  BP:  (!) 161/58 (!) 141/84 (!) 154/85  Pulse:  75 93 63  Resp:  16 20 18   Temp:    97.7 F (36.5 C)  TempSrc:    Oral  SpO2:  97% 95% 100%  Weight: 51.5 kg     Height: 5\' 6"  (1.676 m)       Intake/Output Summary (Last 24 hours) at 03/30/2018 0952 Last data filed at 03/30/2018 0600 Gross per 24 hour  Intake 1974.57 ml  Output 1550 ml  Net 424.57 ml   Filed Weights   03/29/18 0459  Weight: 51.5 kg   Exam  General: Well developed, well nourished, NAD, appears stated age  HEENT: NCAT, mucous membranes moist.   Neck: Supple  Cardiovascular: S1 S2 auscultated, RRR, no murmur  Respiratory: Clear to auscultation bilaterally with equal chest rise  Abdomen: Soft, nontender, nondistended, + bowel sounds  Extremities: warm dry without cyanosis clubbing or edema  Neuro: AAOx3, history of cerebral palsy, nonfocal  Skin: Erythema surrounding left elbow extending into the forearm, improving  Psych: does not, appropriate mood and  affect  Data Reviewed: I have personally reviewed following labs and imaging studies  CBC: Recent Labs  Lab 03/29/18 0133 03/30/18 0439  WBC 9.9 6.4  NEUTROABS 7.4  --   HGB 14.7 14.9  HCT 42.4 43.1  MCV 93.0 92.7  PLT 285 289   Basic Metabolic Panel: Recent Labs  Lab 03/29/18 0133 03/30/18 0439  NA 145 142  K 4.8 4.8  CL 106 106  CO2 30 28  GLUCOSE 93 82  BUN 26* 15  CREATININE 0.85 0.71  CALCIUM 9.8 9.3   GFR: Estimated Creatinine Clearance: 99.2 mL/min (by C-G formula based on SCr of 0.71 mg/dL). Liver Function Tests: Recent Labs  Lab 03/29/18 0133  AST 34  ALT 26  ALKPHOS 61  BILITOT 1.0  PROT 7.9  ALBUMIN 4.2   No results for input(s): LIPASE, AMYLASE in the last 168 hours. No results for input(s): AMMONIA in the last 168 hours. Coagulation Profile: No results for input(s): INR, PROTIME in the last 168 hours. Cardiac Enzymes: No results for input(s): CKTOTAL, CKMB, CKMBINDEX, TROPONINI in the last 168 hours. BNP (last 3 results) No results for input(s): PROBNP in the last 8760 hours. HbA1C: No results for input(s): HGBA1C in the last 72 hours. CBG: No results for input(s): GLUCAP in the last 168 hours. Lipid Profile: No results for input(s): CHOL, HDL, LDLCALC, TRIG, CHOLHDL, LDLDIRECT in the last 72 hours. Thyroid Function Tests: No results for input(s): TSH, T4TOTAL, FREET4, T3FREE, THYROIDAB in the last 72 hours. Anemia Panel: No results for input(s): VITAMINB12, FOLATE, FERRITIN, TIBC, IRON, RETICCTPCT in the last 72 hours. Urine analysis:    Component Value Date/Time   COLORURINE YELLOW 03/29/2018 0308   APPEARANCEUR CLEAR 03/29/2018 0308   LABSPEC 1.023 03/29/2018 0308   PHURINE 6.0 03/29/2018 0308   GLUCOSEU NEGATIVE 03/29/2018 0308   HGBUR NEGATIVE 03/29/2018 0308   BILIRUBINUR NEGATIVE 03/29/2018 0308   KETONESUR 5 (A) 03/29/2018 0308   PROTEINUR NEGATIVE 03/29/2018 0308   NITRITE NEGATIVE 03/29/2018 0308   LEUKOCYTESUR NEGATIVE  03/29/2018 0308   Sepsis Labs: @LABRCNTIP (procalcitonin:4,lacticidven:4)  )No results found for this or any previous visit (from the past 240 hour(s)).    Radiology Studies: Dg Elbow Complete Left  Result Date: 03/29/2018 CLINICAL DATA:  Erythema of the left elbow. Patient has been on antibiotics for left elbow laceration with spreading of the erythema. EXAM: LEFT ELBOW - COMPLETE 3+ VIEW COMPARISON:  None. FINDINGS: Soft tissue swelling of the elbow along the dorsum and radial aspect. No soft tissue emphysema. No acute osseous abnormality. No joint effusion, bone destruction or malalignment of the left elbow is visualized. IMPRESSION: Soft tissue swelling compatible with cellulitis. No acute osseous abnormality. Electronically Signed   By: Tollie Ethavid  Kwon M.D.   On: 03/29/2018 01:37     Scheduled Meds: . amoxicillin  500 mg Oral Q8H  . clonazePAM  0.5 mg Oral BID  . doxycycline  100 mg Oral Q12H  . enoxaparin (LOVENOX) injection  40  mg Subcutaneous Daily  . multivitamin with minerals  1 tablet Oral Q lunch  . tamsulosin  0.4 mg Oral Daily  . tiZANidine  2 mg Oral TID   Continuous Infusions: . sodium chloride 100 mL/hr at 03/30/18 0600     LOS: 1 day   Time Spent in minutes   30 minutes  Lianette Broussard D.O. on 03/30/2018 at 9:52 AM  Between 7am to 7pm - Pager - [phone]  After 7pm go to [email or web address] - password [email or web address]  And look for the night coverage person covering for me after hours  [facility] Office  [phone]

## 2018-03-30 NOTE — Evaluation (Signed)
Physical Therapy Evaluation Patient Details Name: Eddie Valentine MRN: [id number] DOB: 1990-08-29 Today's Date: 03/30/2018   History of Present Illness  27 y.o. male admitted admitted to ED 8/10 with L olecranon cellulitis from falling on elbow a few weeks while exiting plane. PMH includes cerebral palsy, anxiety. Hx of falls, deep brain stimulator.   Clinical Impression   Pt is a 27 YO male admitted for L olecranon cellulitis, with a significant PMH of cerebral palsy. Pt with UE and LE tightness R>L, unsteadiness with gait, decreased activity tolerance, and generalized weakness. Pt with no complaints of L elbow pain during session. As per mother and stepfather's report, pt is at baseline functioning. Pt urinated at end of session today with nursing, as pt has been experiencing urinary retention during hospitalization. PT recommends OPPT at this time given pt's unsteadiness with gait, R>L extremity tightness and spasticity, and pt's decreased tolerance for activity. Pt states that he had to discontinue OPPT earlier this year due to monetary reasons, so basic HEP provided as outlined in exercise flow sheet. Pt does not use AD with ambulation at home or in community, and pt stated that he did not want to use RW during session, so none utilized during session or recommended at this time.     Follow Up Recommendations Outpatient PT    Equipment Recommendations  None recommended by PT    Recommendations for Other Services       Precautions / Restrictions Precautions Precautions: Fall Restrictions Weight Bearing Restrictions: No      Mobility  Bed Mobility Overal bed mobility: Needs Assistance Bed Mobility: Supine to Sit;Sit to Supine     Supine to sit: Supervision;HOB elevated Sit to supine: Supervision;HOB elevated   General bed mobility comments: Increased time to complete, supervision assist for safety.   Transfers Overall transfer level: Needs assistance Equipment used: None(Pt  refused use of RW ) Transfers: Sit to/from Stand Sit to Stand: Min guard         General transfer comment: Min guard for safety, increased time to perform sit to stand. Pt self-steadied upon standing.   Ambulation/Gait Ambulation/Gait assistance: Min guard;Min assist Gait Distance (Feet): 300 Feet Assistive device: 1 person hand held assist(Pt reached out for PT shoulder for steadying occasionally) Gait Pattern/deviations: Step-through pattern;Scissoring;Steppage;Decreased stride length;Drifts right/left;Trunk flexed Gait velocity: decreased.    General Gait Details: CP crouch gait present, with hip adductor, hamstring, and gastroc shortening bilat R>L. Pt with high steppage bilat. Min guard for majority of session, but required min assist during first 30 feet of ambulation for steadying and regaining balance due to pt reports of "feeling anxious", and pt reached out for environment for stability.  Stairs            Wheelchair Mobility    Modified Rankin (Stroke Patients Only)       Balance Overall balance assessment: Needs assistance Sitting-balance support: Bilateral upper extremity supported;Feet supported Sitting balance-Leahy Scale: Fair     Standing balance support: No upper extremity supported;During functional activity Standing balance-Leahy Scale: Fair Standing balance comment: Unsteadiness with perturbations or changes in direction. Periods of self-steadying required.                              Pertinent Vitals/Pain Pain Assessment: No/denies pain(Stomach and urinary discomfort, not described as pain)    Home Living Family/patient expects to be discharged to:: Private residence Living Arrangements: Alone Available Help at Discharge: Friend(s);Available PRN/intermittently;Family  Type of Home: Apartment Home Access: Ramped entrance     Home Layout: One level Home Equipment: Electric scooter Additional Comments: Does not use ADs for  ambulation in home and community. Utilizes scooter for longer distances.     Prior Function Level of Independence: Independent with assistive device(s)         Comments: Utilizes scooter for longer distances and new environments. Does not use ADs while ambulating in home, grocery store. Works in [employer]. Family does not live locally, but when they are in the area they assist pt as needed.      Hand Dominance   Dominant Hand: Left    Extremity/Trunk Assessment   Upper Extremity Assessment Upper Extremity Assessment: Generalized weakness;RUE deficits/detail;LUE deficits/detail RUE Deficits / Details: Elbow flexor tightness secondary to CP spasticity, worse R>L. Jerking wrist and UE motions  RUE Coordination: decreased gross motor;decreased fine motor LUE Coordination: decreased gross motor;decreased fine motor    Lower Extremity Assessment Lower Extremity Assessment: LLE deficits/detail;RLE deficits/detail;Generalized weakness RLE Deficits / Details: Lacking approximately 10 degrees knee extension due to hamstring and gastroc/soleus tightness and spasticity secondary to CP, R adductor tightness R>L.  RLE Coordination: decreased gross motor LLE Deficits / Details: Less spasticity in LLE, but some present in hamstrings and gastroc/soleus LLE Coordination: decreased gross motor       Communication   Communication: Other (comment)(Slow talking speed, difficult to understand at times )  Cognition Arousal/Alertness: Awake/alert Behavior During Therapy: WFL for tasks assessed/performed Overall Cognitive Status: Within Functional Limits for tasks assessed                                        General Comments      Exercises General Exercises - Lower Extremity Ankle Circles/Pumps: AROM;20 reps;Supine;Both Quad Sets: AROM;Supine;10 reps;Both Gluteal Sets: AROM;Both;15 reps;Supine Heel Slides: AROM;Both;Supine;15 reps Hip ABduction/ADduction: AROM;Both;15  reps;Supine(Also given bridges due to pt familiarity with exercise, and hamstring/gastoc SLR stretching in supine position with gait belt as foot strap)   Assessment/Plan    PT Assessment All further PT needs can be met in the next venue of care  PT Problem List Decreased strength;Decreased mobility;Decreased balance;Decreased range of motion       PT Treatment Interventions      PT Goals (Current goals can be found in the Care Plan section)  Acute Rehab PT Goals PT Goal Formulation: All assessment and education complete, DC therapy    Frequency     Barriers to discharge        Co-evaluation               AM-PAC PT "6 Clicks" Daily Activity  Outcome Measure Difficulty turning over in bed (including adjusting bedclothes, sheets and blankets)?: None Difficulty moving from lying on back to sitting on the side of the bed? : None Difficulty sitting down on and standing up from a chair with arms (e.g., wheelchair, bedside commode, etc,.)?: A Little Help needed moving to and from a bed to chair (including a wheelchair)?: A Little Help needed walking in hospital room?: A Little Help needed climbing 3-5 steps with a railing? : A Little 6 Click Score: 20    End of Session Equipment Utilized During Treatment: Gait belt Activity Tolerance: Patient tolerated treatment well;Patient limited by fatigue Patient left: in bed;with bed alarm set;with family/visitor present;with nursing/sitter in room Nurse Communication: Mobility status PT Visit Diagnosis: Other abnormalities  of gait and mobility (R26.89);Difficulty in walking, not elsewhere classified (R26.2)    Time: 1340-1430 PT Time Calculation (min) (ACUTE ONLY): 50 min   Charges:   PT Evaluation $PT Eval Low Complexity: 1 Low PT Treatments $Gait Training: 8-22 mins $Therapeutic Exercise: 8-22 mins        Kashlynn Kundert Conception Chancy, PT, DPT  Pager # [phone]    Vedant Shehadeh D Akeia Perot 03/30/2018, 3:41 PM

## 2018-03-31 DIAGNOSIS — L03114 Cellulitis of left upper limb: Secondary | ICD-10-CM

## 2018-03-31 DIAGNOSIS — M7022 Olecranon bursitis, left elbow: Principal | ICD-10-CM

## 2018-03-31 LAB — BASIC METABOLIC PANEL
Anion gap: 8 (ref 5–15)
BUN: 11 mg/dL (ref 6–20)
CO2: 28 mmol/L (ref 22–32)
Calcium: 9.1 mg/dL (ref 8.9–10.3)
Chloride: 106 mmol/L (ref 98–111)
Creatinine, Ser: 0.69 mg/dL (ref 0.61–1.24)
GFR calc Af Amer: >60 mL/min (ref >60)
GFR calc non Af Amer: >60 mL/min (ref >60)
Glucose, Bld: 81 mg/dL (ref 70–99)
Potassium: 4.7 mmol/L (ref 3.5–5.1)
Sodium: 142 mmol/L (ref 135–145)

## 2018-03-31 LAB — CBC
HCT: 40.8 % (ref 39.0–52.0)
Hemoglobin: 14 g/dL (ref 13.0–17.0)
MCH: 32 pg (ref 26.0–34.0)
MCHC: 34.3 g/dL (ref 30.0–36.0)
MCV: 93.2 fL (ref 78.0–100.0)
Platelets: 263 10*3/uL (ref 150–400)
RBC: 4.38 MIL/uL (ref 4.22–5.81)
RDW: 12.3 % (ref 11.5–15.5)
WBC: 6.3 10*3/uL (ref 4.0–10.5)

## 2018-03-31 MED ORDER — BISACODYL 10 MG RE SUPP: 10.0000 mg | Freq: Every day | RECTAL | Status: DC | PRN

## 2018-03-31 MED ORDER — DOXYCYCLINE HYCLATE 100 MG PO TABS: 100.0000 mg | ORAL_TABLET | Freq: Two times a day (BID) | ORAL | 0 refills | Status: AC

## 2018-03-31 MED ORDER — TAMSULOSIN HCL 0.4 MG PO CAPS: 0.4000 mg | ORAL_CAPSULE | Freq: Every day | ORAL | 0 refills | Status: DC

## 2018-03-31 MED ORDER — BISACODYL 5 MG PO TBEC: 5.0000 mg | DELAYED_RELEASE_TABLET | Freq: Every day | ORAL | 0 refills | Status: DC | PRN

## 2018-03-31 MED ORDER — AMOXICILLIN 500 MG PO CAPS: 500.0000 mg | ORAL_CAPSULE | Freq: Three times a day (TID) | ORAL | 0 refills | Status: AC

## 2018-03-31 NOTE — Progress Notes (Signed)
Pt was d/c'd via wheel chair with his family

## 2018-03-31 NOTE — Discharge Summary (Signed)
Physician Discharge Summary  Eddie Valentine ZOX:[id number] DOB: Dec 30, 1990 DOA: 03/28/2018  PCP: Clayborn Heron, MD  Admit date: 03/28/2018 Discharge date: 03/31/2018  Admitted From: Home  disposition: Home  Recommendations for Outpatient Follow-up:  1. Follow up with PCP in 1-2 weeks 2. Follow-up with orthopedics as an outpatient   Home Health: No Equipment/Devices: None  Discharge Condition: Stable CODE STATUS: Full Diet recommendation: Regular    Brief/Interim Summary: 27 year old male with history of cerebral palsy presented with worsening left elbow erythema after an abrasion which did not improve with oral antibiotics.  He was admitted with cellulitis of left elbow probably secondary to olecranon bursitis.  Orthopedics evaluated the patient and recommended to continue antibiotic with no surgical intervention needed.  He was initially started on intravenous antibiotics and was switched to oral antibiotics and will be discharged on oral doxycycline and amoxicillin for another 10 days.  Discharge Diagnoses:  Principal Problem:   Olecranon bursitis of left elbow Active Problems:   Cellulitis of left elbow  Cellulitis of left elbow with concern for olecranon bursitis -Was started on IV vancomycin but transitioned to oral doxycycline and amoxicillin.  Cultures negative so far.  Orthopedic surgery consult appreciated: No need for any surgical intervention and they recommended to continue antibiotics and using a padded dressing over the elbow to avoid pressure/trauma, outpatient follow-up with orthopedics as needed -Discharge home on oral doxycycline and amoxicillin for 10 more days  Anxiety -Continue Klonopin  History of cerebral palsy -Stable  Transient urinary retention -Improved.  Patient was started on Flomax which will be continued.  Outpatient follow-up  Discharge Instructions  Discharge Instructions    Call MD for:  difficulty breathing, headache or visual  disturbances   Complete by:  As directed    Call MD for:  extreme fatigue   Complete by:  As directed    Call MD for:  hives   Complete by:  As directed    Call MD for:  persistant dizziness or light-headedness   Complete by:  As directed    Call MD for:  persistant nausea and vomiting   Complete by:  As directed    Call MD for:  severe uncontrolled pain   Complete by:  As directed    Call MD for:  temperature >100.4   Complete by:  As directed    Diet general   Complete by:  As directed    Increase activity slowly   Complete by:  As directed      Allergies as of 03/31/2018      Reactions   Oxybutynin Other (See Comments)   hallucinations        Medication List    TAKE these medications   amoxicillin 500 MG capsule Commonly known as:  AMOXIL Take 1 capsule (500 mg total) by mouth every 8 (eight) hours for 10 days.   bisacodyl 5 MG EC tablet Commonly known as:  DULCOLAX Take 1 tablet (5 mg total) by mouth daily as needed for moderate constipation.   clonazePAM 0.5 MG tablet Commonly known as:  KLONOPIN Take 0.5 mg by mouth 2 (two) times daily as needed for anxiety.   doxycycline 100 MG tablet Commonly known as:  VIBRA-TABS Take 1 tablet (100 mg total) by mouth every 12 (twelve) hours for 10 days.   multivitamin with minerals Tabs tablet Take 1 tablet by mouth daily.   naproxen sodium 220 MG tablet Commonly known as:  ALEVE Take 220 mg by mouth 2 (two) times  daily as needed (pain).   tamsulosin 0.4 MG Caps capsule Commonly known as:  FLOMAX Take 1 capsule (0.4 mg total) by mouth daily.   tiZANidine 2 MG tablet Commonly known as:  ZANAFLEX Take 2 mg by mouth 3 (three) times daily.      Follow-up Information    Jodi Geralds, MD Follow up in 2 week(s).   Specialty:  Orthopedic Surgery Why:  Or sooner if needed.  If the patient is doing well and there is really no redness or pain in the elbow I do not think I need to see him. Contact information: [address] [place] [address] [phone]        Clayborn Heron, MD. Schedule an appointment as soon as possible for a visit in 1 week(s).   Specialty:  Family Medicine Contact information: [address] [place] [address] [phone]          Allergies  Allergen Reactions  . Oxybutynin Other (See Comments)    hallucinations      Consultations:  Orthopedics   Procedures/Studies: Dg Elbow Complete Left  Result Date: 03/29/2018 CLINICAL DATA:  Erythema of the left elbow. Patient has been on antibiotics for left elbow laceration with spreading of the erythema. EXAM: LEFT ELBOW - COMPLETE 3+ VIEW COMPARISON:  None. FINDINGS: Soft tissue swelling of the elbow along the dorsum and radial aspect. No soft tissue emphysema. No acute osseous abnormality. No joint effusion, bone destruction or malalignment of the left elbow is visualized. IMPRESSION: Soft tissue swelling compatible with cellulitis. No acute osseous abnormality. Electronically Signed   By: Tollie Eth M.D.   On: 03/29/2018 01:37      Subjective: Patient was seen and examined at bedside.  He feels better and wants to go home.  No overnight fever, nausea or vomiting.  Discharge Exam: Vitals:   03/30/18 2101 03/31/18 0637  BP: 119/86 116/75  Pulse: 85 65  Resp: 18 18  Temp: 98.3 F (36.8 C) 97.6 F (36.4 C)  SpO2: 100% 94%   Vitals:   03/30/18 1433 03/30/18 1457 03/30/18 2101 03/31/18 0637  BP: (!) 78/58 (!) 125/108 119/86 116/75  Pulse: 92 79 85 65  Resp: 18  18 18   Temp:   98.3 F (36.8 C) 97.6 F (36.4 C)  TempSrc:   Oral Oral  SpO2: 99%  100% 94%  Weight:      Height:        General: Pt is alert, awake, not in acute distress Cardiovascular: rate controlled, S1/S2 + Respiratory: bilateral decreased breath sounds at bases Abdominal: Soft, NT, ND, bowel sounds + Extremities: no edema, no cyanosis.  Left elbow dressing present    The results of significant diagnostics from this  hospitalization (including imaging, microbiology, ancillary and laboratory) are listed below for reference.     Microbiology: Recent Results (from the past 240 hour(s))  Culture, blood (routine x 2)     Status: None (Preliminary result)   Collection Time: 03/29/18  1:33 AM  Result Value Ref Range Status   Specimen Description   Final    BLOOD RIGHT FOREARM Performed at [facility], [address] [place], [place] [address]    Special Requests   Final    BOTTLES DRAWN AEROBIC AND ANAEROBIC Blood Culture results may not be optimal due to an excessive volume of blood received in culture bottles Performed at [facility], [address] [place], [place] [address]    Culture  Final    NO GROWTH 2 DAYS Performed at [facility], [address] [place], [place] [address]    Report Status PENDING  Incomplete  Culture, blood (routine x 2)     Status: None (Preliminary result)   Collection Time: 03/29/18  1:33 AM  Result Value Ref Range Status   Specimen Description   Final    BLOOD RIGHT FOREARM Performed at [facility], [address] [place], [place] [address]    Special Requests   Final    BOTTLES DRAWN AEROBIC AND ANAEROBIC Blood Culture results may not be optimal due to an inadequate volume of blood received in culture bottles Performed at [facility], [address] [place], [place] [address]    Culture   Final    NO GROWTH 2 DAYS Performed at [facility], [address] [place], [place] [address]    Report Status PENDING  Incomplete     Labs: BNP (last 3 results) No results for input(s): BNP in the last 8760 hours. Basic Metabolic Panel: Recent Labs  Lab 03/29/18 0133 03/30/18 0439 03/31/18 0421  NA 145 142 142  K 4.8 4.8 4.7  CL 106 106 106  CO2 30 28 28   GLUCOSE 93 82 81  BUN 26* 15 11  CREATININE 0.85 0.71 0.69  CALCIUM 9.8 9.3 9.1   Liver Function  Tests: Recent Labs  Lab 03/29/18 0133  AST 34  ALT 26  ALKPHOS 61  BILITOT 1.0  PROT 7.9  ALBUMIN 4.2   No results for input(s): LIPASE, AMYLASE in the last 168 hours. No results for input(s): AMMONIA in the last 168 hours. CBC: Recent Labs  Lab 03/29/18 0133 03/30/18 0439 03/31/18 0421  WBC 9.9 6.4 6.3  NEUTROABS 7.4  --   --   HGB 14.7 14.9 14.0  HCT 42.4 43.1 40.8  MCV 93.0 92.7 93.2  PLT 285 289 263   Cardiac Enzymes: No results for input(s): CKTOTAL, CKMB, CKMBINDEX, TROPONINI in the last 168 hours. BNP: Invalid input(s): POCBNP CBG: No results for input(s): GLUCAP in the last 168 hours. D-Dimer No results for input(s): DDIMER in the last 72 hours. Hgb A1c No results for input(s): HGBA1C in the last 72 hours. Lipid Profile No results for input(s): CHOL, HDL, LDLCALC, TRIG, CHOLHDL, LDLDIRECT in the last 72 hours. Thyroid function studies No results for input(s): TSH, T4TOTAL, T3FREE, THYROIDAB in the last 72 hours.  Invalid input(s): FREET3 Anemia work up No results for input(s): VITAMINB12, FOLATE, FERRITIN, TIBC, IRON, RETICCTPCT in the last 72 hours. Urinalysis    Component Value Date/Time   COLORURINE YELLOW 03/29/2018 0308   APPEARANCEUR CLEAR 03/29/2018 0308   LABSPEC 1.023 03/29/2018 0308   PHURINE 6.0 03/29/2018 0308   GLUCOSEU NEGATIVE 03/29/2018 0308   HGBUR NEGATIVE 03/29/2018 0308   BILIRUBINUR NEGATIVE 03/29/2018 0308   KETONESUR 5 (A) 03/29/2018 0308   PROTEINUR NEGATIVE 03/29/2018 0308   NITRITE NEGATIVE 03/29/2018 0308   LEUKOCYTESUR NEGATIVE 03/29/2018 0308   Sepsis Labs Invalid input(s): PROCALCITONIN,  WBC,  LACTICIDVEN Microbiology Recent Results (from the past 240 hour(s))  Culture, blood (routine x 2)     Status: None (Preliminary result)   Collection Time: 03/29/18  1:33 AM  Result Value Ref Range Status   Specimen Description   Final    BLOOD RIGHT FOREARM Performed at [facility], [address] [place], [place] [address]    Special Requests  Final    BOTTLES DRAWN AEROBIC AND ANAEROBIC Blood Culture results may not be optimal due to an excessive volume of blood received in culture bottles Performed at [facility], [address] [place], [place] [address]    Culture   Final    NO GROWTH 2 DAYS Performed at [facility], [address] [place], [place] [address]    Report Status PENDING  Incomplete  Culture, blood (routine x 2)     Status: None (Preliminary result)   Collection Time: 03/29/18  1:33 AM  Result Value Ref Range Status   Specimen Description   Final    BLOOD RIGHT FOREARM Performed at [facility], [address] [place], [place] [address]    Special Requests   Final    BOTTLES DRAWN AEROBIC AND ANAEROBIC Blood Culture results may not be optimal due to an inadequate volume of blood received in culture bottles Performed at [facility], [address] [place], [place] [address]    Culture   Final    NO GROWTH 2 DAYS Performed at [facility], [address] [place], [place] [address]    Report Status PENDING  Incomplete     Time coordinating discharge: 35 minutes  SIGNED:   Glade LloydKshitiz Haytham Maher, MD  [facility] 03/31/2018, 11:53 AM Pager: [phone]  If 7PM-7AM, please contact night-coverage [email or web address] Password [email or web address]

## 2018-04-03 LAB — CULTURE, BLOOD (ROUTINE X 2)
Culture: NO GROWTH
Culture: NO GROWTH

## 2018-06-07 ENCOUNTER — Encounter: Payer: Self-pay | Admitting: Emergency Medicine

## 2018-06-07 DIAGNOSIS — F429 Obsessive-compulsive disorder, unspecified: Secondary | ICD-10-CM

## 2018-06-18 ENCOUNTER — Encounter: Payer: Self-pay | Admitting: Physician Assistant

## 2018-06-18 ENCOUNTER — Ambulatory Visit (INDEPENDENT_AMBULATORY_CARE_PROVIDER_SITE_OTHER): Payer: 59 | Admitting: Physician Assistant

## 2018-06-18 DIAGNOSIS — F411 Generalized anxiety disorder: Secondary | ICD-10-CM | POA: Diagnosis not present

## 2018-06-18 DIAGNOSIS — F331 Major depressive disorder, recurrent, moderate: Secondary | ICD-10-CM

## 2018-06-18 MED ORDER — HYDROXYZINE HCL 10 MG PO TABS: ORAL_TABLET | ORAL | 0 refills | Status: DC

## 2018-06-18 NOTE — Progress Notes (Signed)
[facility]  Patient ID: Eddie Valentine,  MRN: [id number]  PCP: Clayborn Heron, MD  Date of Evaluation: 06/18/2018 Time spent:60 minutes  Chief Complaint:  Chief Complaint    Depression; Anxiety      HISTORY/CURRENT STATUS: HPI He saw Anne Fu, PA in our practice a month ago.  Pt didn't feel comfortable with him and wants to 'try me out.'   He's had a lot of deaths in the past year.  His dad, who he was really close to, died in 07-12-23 of last year.  A cousin committed suicide,, a friend who died, his mom's friend's son was hit by a car a few weeks ago, and a barista at [employer] killed herself.  He saw her often.  "2020 has to be better than this year."  Was put on Trintellix last month and "I feel fantastic" with mood but still has a lot of anxiety.  Wakes up every morning with anxiety. Sometimes it can last all day.  Generalized most of the time. But rarely he will have CP and palpitations, and tachycardia.  He takes Klonopin 0.5mg , 1-3 pills a day, which helps.  He takes the Klonopin for muscle tension and spasms from the cerebral palsy just as much as he takes it for the anxiety.  He really does not want to take it often.  States he does not like medication and does not want to get addicted to this medication.  Since going on the Trintellix, he has had more energy and motivation, he is not in such a blue mood, and things are just better all around, he states.  He continues to work full-time.  States he loves life and wants to leave it to his fullest.  States he sleeps well.  Denies SI/HI.  No AH/VH  Individual Medical History/ Review of Systems: Changes? :No   Allergies: Oxybutynin  Current Medications:  Current Outpatient Medications:  .  clonazePAM (KLONOPIN) 0.5 MG tablet, Take 0.5 mg by mouth 2 (two) times daily as needed for anxiety., Disp: , Rfl:  .  Multiple Vitamin (MULTIVITAMIN WITH MINERALS) TABS tablet, Take 1 tablet by mouth daily., Disp: ,  Rfl:  .  naproxen sodium (ALEVE) 220 MG tablet, Take 220 mg by mouth 2 (two) times daily as needed (pain)., Disp: , Rfl:  .  tiZANidine (ZANAFLEX) 2 MG tablet, Take 2 mg by mouth 3 (three) times daily. , Disp: , Rfl:  .  vortioxetine HBr (TRINTELLIX) 10 MG TABS tablet, Take 10 mg by mouth every evening., Disp: , Rfl:  .  bisacodyl (DULCOLAX) 5 MG EC tablet, Take 1 tablet (5 mg total) by mouth daily as needed for moderate constipation. (Patient not taking: Reported on 06/18/2018), Disp: 10 tablet, Rfl: 0 .  hydrOXYzine (ATARAX/VISTARIL) 10 MG tablet, 1-2 q8hr prn anxiety, Disp: 60 tablet, Rfl: 0 .  tamsulosin (FLOMAX) 0.4 MG CAPS capsule, Take 1 capsule (0.4 mg total) by mouth daily. (Patient not taking: Reported on 06/18/2018), Disp: 30 capsule, Rfl: 0 Medication Side Effects: none  Family Medical/ Social History: Changes? No  MENTAL HEALTH EXAM:  There were no vitals taken for this visit.There is no height or weight on file to calculate BMI.  General Appearance: Casual contractures in arms and legs. Walks with difficulty due to cerebral palsy  Eye Contact:  Good  Speech:  Garbled due to CP.  Understandable  Volume:  Normal  Mood:  Euthymic  Affect:  Appropriate  Thought Process:  Goal Directed  Orientation:  Full (Time, Place, and Person)  Thought Content: Logical   Suicidal Thoughts:  No  Homicidal Thoughts:  No  Memory:  WNL  Judgement:  Good  Insight:  Good  Psychomotor Activity:  difficult to assess b/c of Cerebral Palsy  Concentration:  Concentration: Good  Recall:  Good  Fund of Knowledge: Good  Language: Good  Assets:  Desire for Improvement  ADL's:  Intact  Cognition: WNL  Prognosis:  Good    DIAGNOSES:    ICD-10-CM   1. Major depressive disorder, recurrent episode, moderate (HCC) F33.1   2. Generalized anxiety disorder F41.1     Receiving Psychotherapy: No    RECOMMENDATIONS: I spent 60 minutes with him obtaining and reviewing his complete history. He seems  to be doing very well on the Trintellix at this dose so we agreed to leave it.  It may need to be increased in order to help prevent some of the anxiety he is having however.  He will think about it. Continue the Klonopin as directed. Start hydroxyzine which he may take instead of the Klonopin for anxiety as needed. I recommend BuSpar but he has been told it may cause nausea and he does not want to take that. We also discussed clonidine may help prevent anxiety his blood pressure closely.  He prefers not to take that. Return in 4 to 6 weeks.   Melony Overly, PA-C

## 2018-07-29 ENCOUNTER — Ambulatory Visit (INDEPENDENT_AMBULATORY_CARE_PROVIDER_SITE_OTHER): Payer: 59 | Admitting: Physician Assistant

## 2018-07-29 ENCOUNTER — Encounter: Payer: Self-pay | Admitting: Physician Assistant

## 2018-07-29 DIAGNOSIS — F411 Generalized anxiety disorder: Secondary | ICD-10-CM

## 2018-07-29 DIAGNOSIS — F331 Major depressive disorder, recurrent, moderate: Secondary | ICD-10-CM

## 2018-07-29 MED ORDER — VORTIOXETINE HBR 10 MG PO TABS: 10.0000 mg | ORAL_TABLET | Freq: Every evening | ORAL | 2 refills | Status: DC

## 2018-07-29 MED ORDER — HYDROXYZINE HCL 10 MG PO TABS: ORAL_TABLET | ORAL | 2 refills | Status: DC

## 2018-07-29 NOTE — Progress Notes (Signed)
[facility]  Patient ID: Januel Doolan,  MRN: [id number]  PCP: Clayborn Heron, MD  Date of Evaluation: 07/29/2018 Time spent:15 minutes  Chief Complaint:  Chief Complaint    Follow-up      HISTORY/CURRENT STATUS: HPI Here for 6 week med check.  Feels about 65% better but still has some anxiety and sadness still missing his dad.  He made it through Thanksgiving.  Has traveled some and was with some of his family on the anniversary of his father's death.  Patient went to synagogue as is a normal Jewish custom.  Patient denies loss of interest in usual activities and is able to enjoy things.  Denies decreased energy or motivation.  Appetite has not changed.  No extreme sadness, tearfulness, or feelings of hopelessness.  Denies any changes in concentration, making decisions or remembering things.  Denies suicidal or homicidal thoughts.  He still quite sad of course but he does not want to increase the medication.  Anxiety is still an issue at times.  He takes Klonopin when needed especially with flying.  He is also used the hydroxyzine some which does help but does not cause too much sedation  Individual Medical History/ Review of Systems: Changes? :No    Past medications for mental health diagnoses include: None  Allergies: Oxybutynin  Current Medications:  Current Outpatient Medications:  .  bisacodyl (DULCOLAX) 5 MG EC tablet, Take 1 tablet (5 mg total) by mouth daily as needed for moderate constipation., Disp: 10 tablet, Rfl: 0 .  clonazePAM (KLONOPIN) 0.5 MG tablet, Take 0.5 mg by mouth 2 (two) times daily as needed for anxiety., Disp: , Rfl:  .  hydrOXYzine (ATARAX/VISTARIL) 10 MG tablet, 1-2 q8hr prn anxiety, Disp: 60 tablet, Rfl: 2 .  Multiple Vitamin (MULTIVITAMIN WITH MINERALS) TABS tablet, Take 1 tablet by mouth daily., Disp: , Rfl:  .  naproxen sodium (ALEVE) 220 MG tablet, Take 220 mg by mouth 2 (two) times daily as needed (pain)., Disp: , Rfl:  .   tamsulosin (FLOMAX) 0.4 MG CAPS capsule, Take 1 capsule (0.4 mg total) by mouth daily., Disp: 30 capsule, Rfl: 0 .  tiZANidine (ZANAFLEX) 2 MG tablet, Take 2 mg by mouth 3 (three) times daily. , Disp: , Rfl:  .  vortioxetine HBr (TRINTELLIX) 10 MG TABS tablet, Take 1 tablet (10 mg total) by mouth every evening., Disp: 30 tablet, Rfl: 2 Medication Side Effects: none  Family Medical/ Social History: Changes? No  MENTAL HEALTH EXAM:  There were no vitals taken for this visit.There is no height or weight on file to calculate BMI.  General Appearance: Casual and Well Groomed  Eye Contact:  Good  Speech:  Garbled  Volume:  Normal  Mood:  Euthymic  Affect:  Appropriate  Thought Process:  Goal Directed  Orientation:  Full (Time, Place, and Person)  Thought Content: Logical   Suicidal Thoughts:  No  Homicidal Thoughts:  No  Memory:  WNL  Judgement:  Good  Insight:  Good  Psychomotor Activity:  abnl due to cerebral palsy  Concentration:  Concentration: Good  Recall:  Good  Fund of Knowledge: Good  Language: Good  Assets:  Desire for Improvement  ADL's:  Intact  Cognition: WNL  Prognosis:  Good    DIAGNOSES:    ICD-10-CM   1. Major depressive disorder, recurrent episode, moderate (HCC) F33.1   2. Generalized anxiety disorder F41.1     Receiving Psychotherapy: No    RECOMMENDATIONS: I would really like for  him to increase the Trintellix but he is very hesitant.  We agreed to leave it the same at this point but knowing that it can be increased at any time. Continue same as needed. Return in about 6 weeks.  Melony Overlyeresa Iviona Hole, PA-C

## 2018-09-03 ENCOUNTER — Encounter: Payer: Self-pay | Admitting: Physician Assistant

## 2018-09-03 ENCOUNTER — Ambulatory Visit (INDEPENDENT_AMBULATORY_CARE_PROVIDER_SITE_OTHER): Payer: 59 | Admitting: Physician Assistant

## 2018-09-03 DIAGNOSIS — F411 Generalized anxiety disorder: Secondary | ICD-10-CM

## 2018-09-03 DIAGNOSIS — F331 Major depressive disorder, recurrent, moderate: Secondary | ICD-10-CM

## 2018-09-03 NOTE — Progress Notes (Signed)
[facility]  Patient ID: Eddie Valentine,  MRN: [id number]  PCP: Clayborn Heron, MD  Date of Evaluation: 09/03/2018 Time spent:15 minutes  Chief Complaint:  Chief Complaint    Follow-up      HISTORY/CURRENT STATUS: HPI Here for 4 week med check.  Doing great!  Has fallen a few times since LOV. Hurt his back and now on inflammatory meds for that.  He falls off and on and if this has not gotten worse since he started the Trintellix or hydroxyzine.  States he is at least 65% better than he was when he first came here.Patient denies loss of interest in usual activities and is able to enjoy things.  Denies decreased energy or motivation.  Appetite has not changed.  No extreme sadness, tearfulness, or feelings of hopelessness.  Denies any changes in concentration, making decisions or remembering things.  Denies suicidal or homicidal thoughts.  Sometimes he gets anxious but he can control it with coping skills and Klonopin.  Individual Medical History/ Review of Systems: Changes? :Yes Back pain secondary to recent fall  Allergies: Oxybutynin  Current Medications:  Current Outpatient Medications:  .  clonazePAM (KLONOPIN) 0.5 MG tablet, Take 0.5 mg by mouth 2 (two) times daily as needed for anxiety., Disp: , Rfl:  .  hydrOXYzine (ATARAX/VISTARIL) 10 MG tablet, 1-2 q8hr prn anxiety, Disp: 60 tablet, Rfl: 2 .  meloxicam (MOBIC) 15 MG tablet, , Disp: , Rfl:  .  Multiple Vitamin (MULTIVITAMIN WITH MINERALS) TABS tablet, Take 1 tablet by mouth daily., Disp: , Rfl:  .  tiZANidine (ZANAFLEX) 2 MG tablet, Take 2 mg by mouth 3 (three) times daily. , Disp: , Rfl:  .  vortioxetine HBr (TRINTELLIX) 10 MG TABS tablet, Take 1 tablet (10 mg total) by mouth every evening., Disp: 30 tablet, Rfl: 2 .  bisacodyl (DULCOLAX) 5 MG EC tablet, Take 1 tablet (5 mg total) by mouth daily as needed for moderate constipation. (Patient not taking: Reported on 09/03/2018), Disp: 10 tablet, Rfl: 0 .   naproxen sodium (ALEVE) 220 MG tablet, Take 220 mg by mouth 2 (two) times daily as needed (pain)., Disp: , Rfl:  .  tamsulosin (FLOMAX) 0.4 MG CAPS capsule, Take 1 capsule (0.4 mg total) by mouth daily. (Patient not taking: Reported on 09/03/2018), Disp: 30 capsule, Rfl: 0 Medication Side Effects: none  Family Medical/ Social History: Changes? No  MENTAL HEALTH EXAM:  There were no vitals taken for this visit.There is no height or weight on file to calculate BMI.  General Appearance: Casual and Well Groomed  Eye Contact:  Good  Speech:  Clear and Coherent  Volume:  Normal  Mood:  Euthymic  Affect:  Appropriate  Thought Process:  Goal Directed  Orientation:  Full (Time, Place, and Person)  Thought Content: Logical   Suicidal Thoughts:  No  Homicidal Thoughts:  No  Memory:  WNL  Judgement:  Good  Insight:  Good  Psychomotor Activity:  has cerebral palsy.  Able to walk but with jerky movements.  Concentration:  Concentration: Good  Recall:  Good  Fund of Knowledge: Good  Language: Good  Assets:  Desire for Improvement  ADL's:  Intact  Cognition: WNL  Prognosis:  Good    DIAGNOSES:    ICD-10-CM   1. Major depressive disorder, recurrent episode, moderate (HCC) F33.1   2. Generalized anxiety disorder F41.1     Receiving Psychotherapy: No    RECOMMENDATIONS: Continue Trintellix 10 mg p.o. daily. Continue hydroxyzine 10 mg  1-2 every 8 hours as needed anxiety. Continue Klonopin 0.5 mg p.o. twice daily as needed (per neuro) Return in 2 months.   Melony Overly, PA-C

## 2018-09-10 ENCOUNTER — Telehealth: Payer: Self-pay | Admitting: Physician Assistant

## 2018-09-10 ENCOUNTER — Other Ambulatory Visit: Payer: Self-pay | Admitting: Physician Assistant

## 2018-09-10 MED ORDER — CLONAZEPAM 0.5 MG PO TABS: 0.5000 mg | ORAL_TABLET | Freq: Three times a day (TID) | ORAL | 0 refills | Status: DC | PRN

## 2018-09-10 NOTE — Telephone Encounter (Signed)
Patient called and says he needs a refill on his clonazapam 0.5mg  which he takes 3 times a day. His neurologist prescribed it but he is unable to get them to call him back. Please escribe to [facility] on [address]

## 2018-09-10 NOTE — Telephone Encounter (Signed)
I sent it in.  No need to call him back.  He should get notification from pharm.

## 2018-10-28 ENCOUNTER — Other Ambulatory Visit: Payer: Self-pay | Admitting: Physician Assistant

## 2018-11-04 ENCOUNTER — Ambulatory Visit: Payer: 59 | Admitting: Physician Assistant

## 2018-11-24 ENCOUNTER — Other Ambulatory Visit: Payer: Self-pay | Admitting: Physician Assistant

## 2018-12-02 ENCOUNTER — Other Ambulatory Visit: Payer: Self-pay | Admitting: Physician Assistant

## 2018-12-21 ENCOUNTER — Other Ambulatory Visit: Payer: Self-pay | Admitting: Physician Assistant

## 2018-12-28 ENCOUNTER — Encounter: Payer: Self-pay | Admitting: Physician Assistant

## 2018-12-28 ENCOUNTER — Ambulatory Visit (INDEPENDENT_AMBULATORY_CARE_PROVIDER_SITE_OTHER): Payer: 59 | Admitting: Physician Assistant

## 2018-12-28 ENCOUNTER — Other Ambulatory Visit: Payer: Self-pay

## 2018-12-28 DIAGNOSIS — F331 Major depressive disorder, recurrent, moderate: Secondary | ICD-10-CM

## 2018-12-28 DIAGNOSIS — F411 Generalized anxiety disorder: Secondary | ICD-10-CM

## 2018-12-28 MED ORDER — HYDROXYZINE HCL 10 MG PO TABS: ORAL_TABLET | ORAL | 2 refills | Status: DC

## 2018-12-28 MED ORDER — CLONAZEPAM 0.5 MG PO TABS: 0.5000 mg | ORAL_TABLET | Freq: Three times a day (TID) | ORAL | 2 refills | Status: DC | PRN

## 2018-12-28 MED ORDER — VORTIOXETINE HBR 10 MG PO TABS: 10.0000 mg | ORAL_TABLET | Freq: Every day | ORAL | 2 refills | Status: DC

## 2018-12-28 NOTE — Progress Notes (Signed)
[facility]  Patient ID: Eddie Valentine,  MRN: [id number]  PCP: Clayborn Heronankins, Victoria R, MD  Date of Evaluation: 12/28/2018 Time spent:15 minutes  Chief Complaint:  Chief Complaint    Anxiety; Follow-up     Virtual Visit via Telephone Note  I connected with patient by a video enabled telemedicine application or telephone, with their informed consent, and verified patient privacy and that I am speaking with the correct person using two identifiers.  I am private, in my home and the patient is home.  I discussed the limitations, risks, security and privacy concerns of performing an evaluation and management service by telephone and the availability of in person appointments. I also discussed with the patient that there may be a patient responsible charge related to this service. The patient expressed understanding and agreed to proceed.   I discussed the assessment and treatment plan with the patient. The patient was provided an opportunity to ask questions and all were answered. The patient agreed with the plan and demonstrated an understanding of the instructions.   The patient was advised to call back or seek an in-person evaluation if the symptoms worsen or if the condition fails to improve as anticipated.  I provided 15 minutes of non-face-to-face time during this encounter.  HISTORY/CURRENT STATUS: HPI  For routine med check. Needs RF  Nervous about the coronavirus.  He had sx of it a week or so ago and was tested.  Waiting on results. He feels better from the chills that he had.    If it wasn't for the COVID pandemic states he'd be doing good.  He doesn't like having lost his independence, but he has friends who are taking him food and stuff.    Patient denies loss of interest in usual activities and is able to enjoy things.  Denies decreased energy or motivation.  Appetite has not changed.  No extreme sadness, tearfulness, or feelings of hopelessness.  Denies any  changes in concentration, making decisions or remembering things.  Denies suicidal or homicidal thoughts.  Anxiety was well-controlled until the worries over the coronavirus.  He denies any new musculoskeletal or neurologic symptoms.  He has cerebral palsy and new symptoms are difficult to distinguish though.    Individual Medical History/ Review of Systems: Changes? :No    Past medications for mental health diagnoses include: None  Allergies: Oxybutynin  Current Medications:  Current Outpatient Medications:  .  clonazePAM (KLONOPIN) 0.5 MG tablet, Take 1 tablet (0.5 mg total) by mouth 3 (three) times daily as needed for anxiety., Disp: 90 tablet, Rfl: 2 .  hydrOXYzine (ATARAX/VISTARIL) 10 MG tablet, TAKE 1 TO 2 TABS BY MOUTH EVERY 8 HOURS AS NEEDED FOR ANXIETY, Disp: 60 tablet, Rfl: 2 .  meloxicam (MOBIC) 15 MG tablet, , Disp: , Rfl:  .  Multiple Vitamin (MULTIVITAMIN WITH MINERALS) TABS tablet, Take 1 tablet by mouth daily., Disp: , Rfl:  .  tiZANidine (ZANAFLEX) 2 MG tablet, Take 2 mg by mouth 3 (three) times daily. , Disp: , Rfl:  .  vortioxetine HBr (TRINTELLIX) 10 MG TABS tablet, Take 1 tablet (10 mg total) by mouth daily., Disp: 30 tablet, Rfl: 2 .  bisacodyl (DULCOLAX) 5 MG EC tablet, Take 1 tablet (5 mg total) by mouth daily as needed for moderate constipation. (Patient not taking: Reported on 09/03/2018), Disp: 10 tablet, Rfl: 0 .  naproxen sodium (ALEVE) 220 MG tablet, Take 220 mg by mouth 2 (two) times daily as needed (pain)., Disp: , Rfl:  .  tamsulosin (FLOMAX) 0.4 MG CAPS capsule, Take 1 capsule (0.4 mg total) by mouth daily. (Patient not taking: Reported on 09/03/2018), Disp: 30 capsule, Rfl: 0 Medication Side Effects: none  Family Medical/ Social History: Changes? Yes coronavirus shelter in place.  He's in quarantine, while awaiting his own test results.    MENTAL HEALTH EXAM:  There were no vitals taken for this visit.There is no height or weight on file to calculate  BMI.  General Appearance: unable to assess  Eye Contact:  unable to assess  Speech:  Garbled and a little difficult to understand d/t CP  Volume:  Normal  Mood:  Anxious  Affect:  unable to assess  Thought Process:  Goal Directed  Orientation:  Full (Time, Place, and Person)  Thought Content: Logical   Suicidal Thoughts:  No  Homicidal Thoughts:  No  Memory:  WNL  Judgement:  Good  Insight:  Good  Psychomotor Activity:  unable to assess  Concentration:  Concentration: Good  Recall:  Good  Fund of Knowledge: Good  Language: Good  Assets:  Desire for Improvement  ADL's:  Intact  Cognition: WNL  Prognosis:  Good    DIAGNOSES:    ICD-10-CM   1. Generalized anxiety disorder F41.1   2. Major depressive disorder, recurrent episode, moderate (HCC) F33.1   He is awaiting COVID-19 results.  No longer symptomatic but in quarantine.  Receiving Psychotherapy: No    RECOMMENDATIONS:  Continue Trintellix 10 mg daily. Continue Klonopin 0.5 mg up to 3 times daily as needed. Continue hydroxyzine 10 mg 1-2 every 8 hours as needed anxiety. Return in 3 months or sooner as needed.  Melony Overly, PA-C   This record has been created using [employer].  Chart creation errors have been sought, but may not always have been located and corrected. Such creation errors do not reflect on the standard of medical care.

## 2019-01-09 ENCOUNTER — Other Ambulatory Visit: Payer: Self-pay | Admitting: Physician Assistant

## 2019-03-03 IMAGING — DX DG SHOULDER 2+V PORT*R*
2 series · 2 of 2 positions shown · non-contrast
Comparison: Concurrently obtained radiographs of the thoracic and
lumbar spine

CLINICAL DATA: 26-year-old male with posterior scapular pain after
falling out of his motorized wheelchair

EXAM:
PORTABLE RIGHT SHOULDER

[shoulder ap]
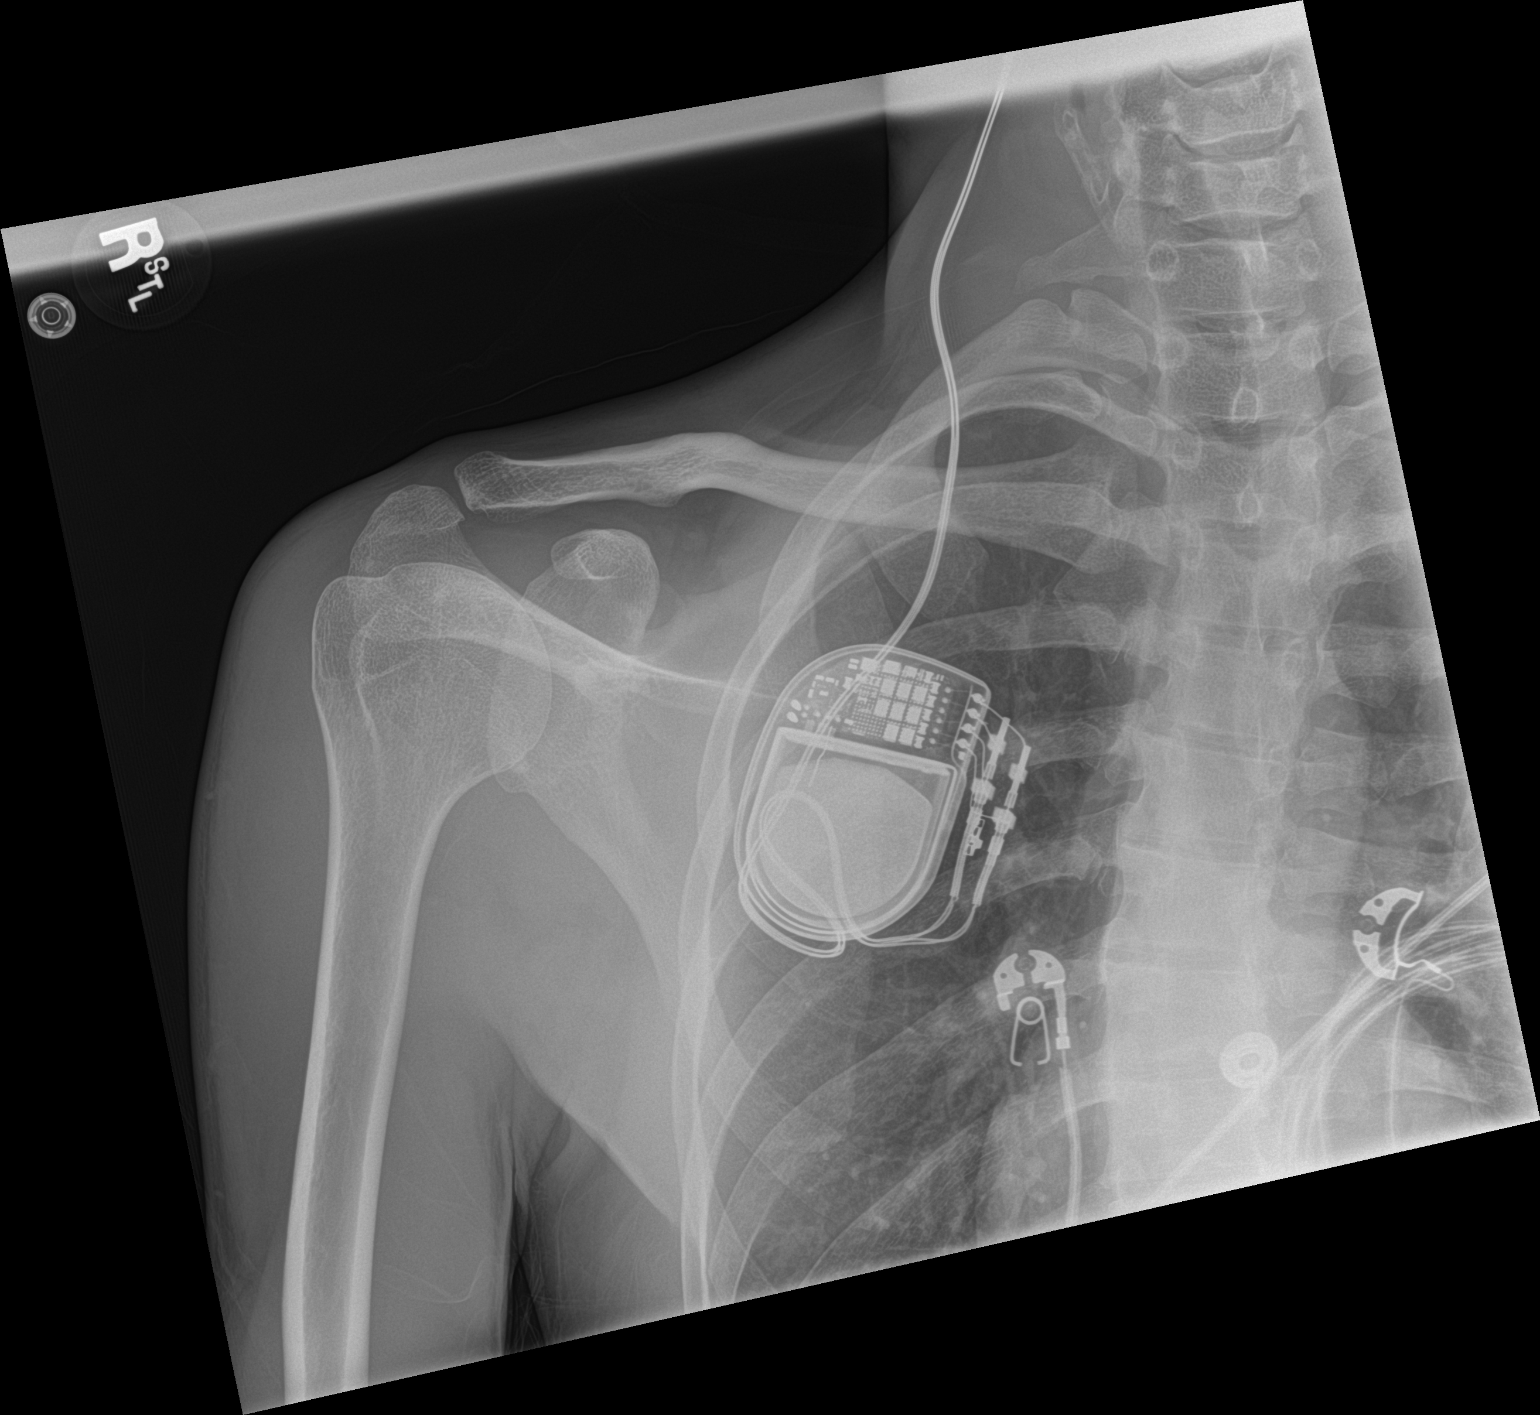

[shoulder obl]
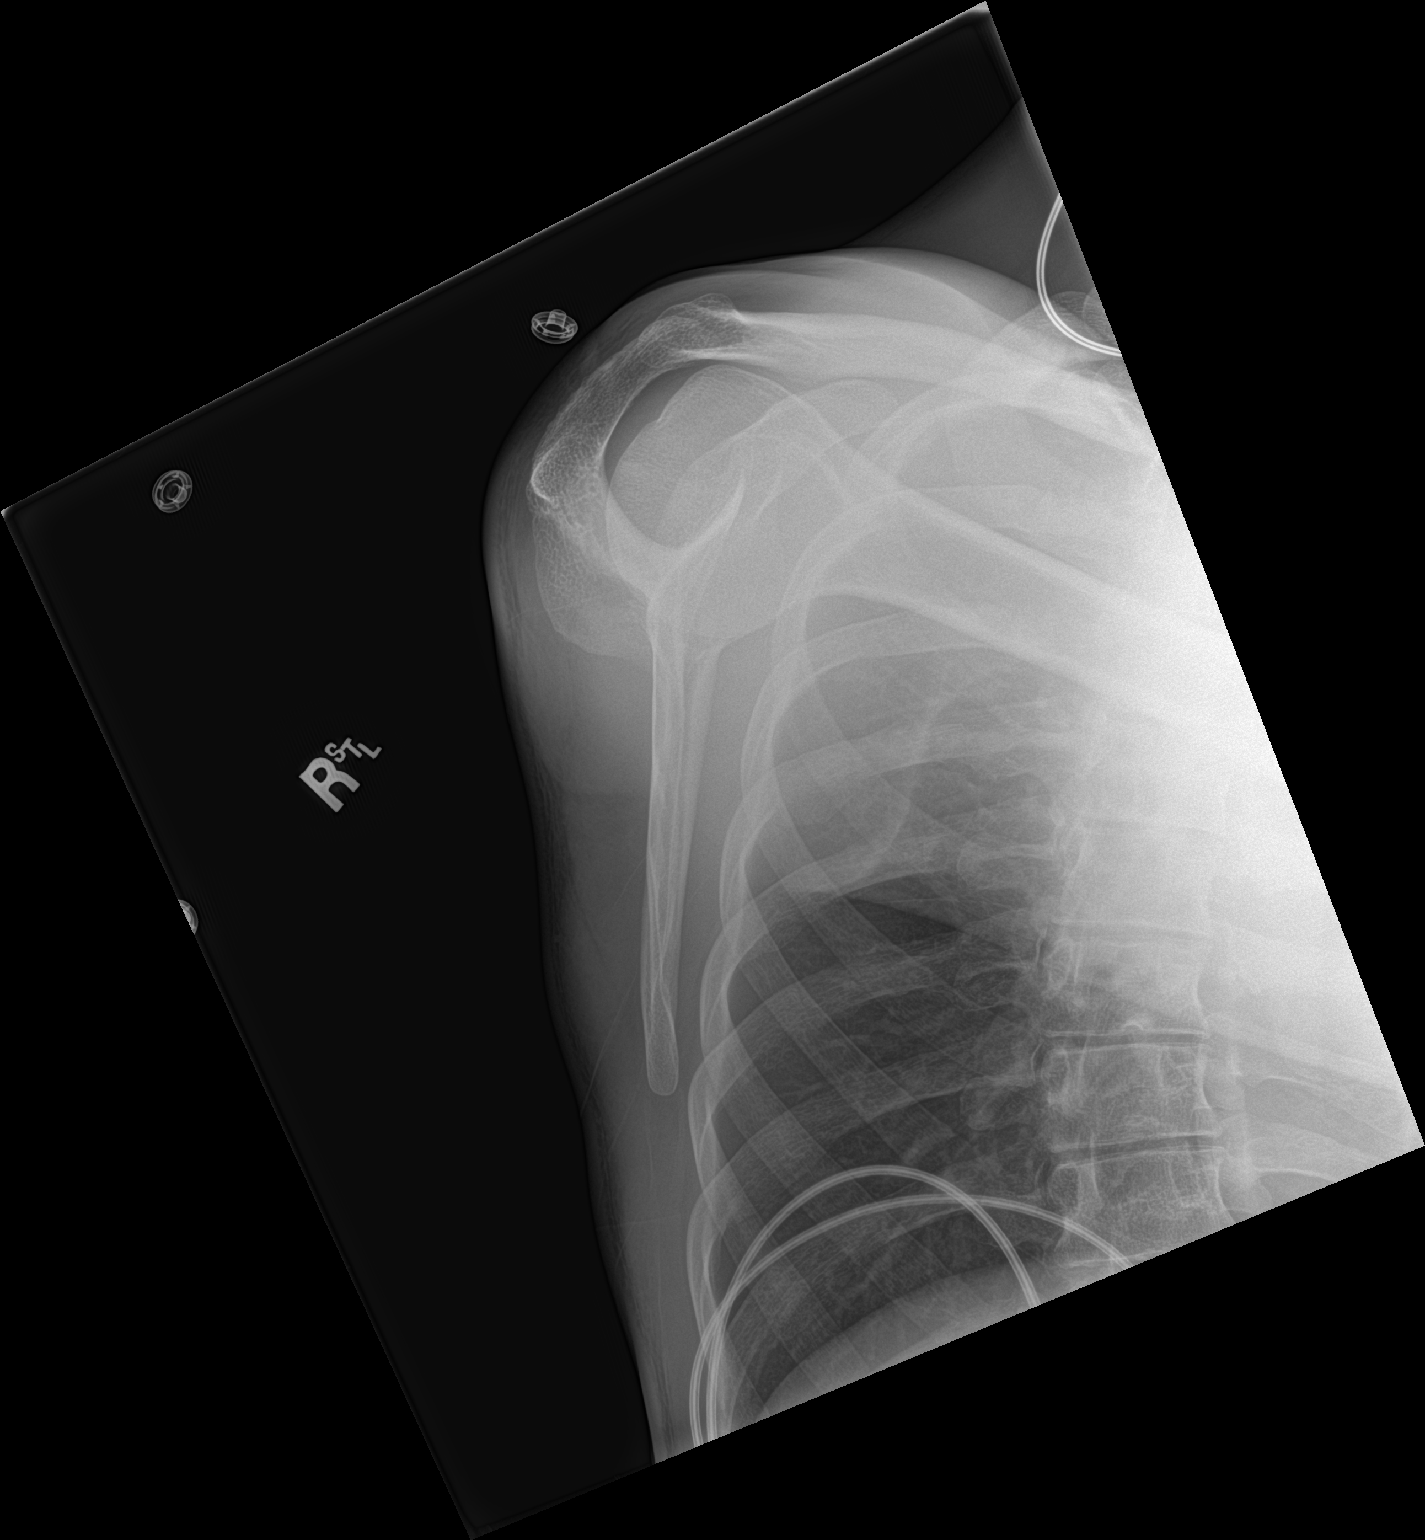

[2 of 2 positions shown; findings below may reference images not displayed]

FINDINGS: There is no evidence of fracture or dislocation. There is no
evidence of arthropathy or other focal bone abnormality. Soft
tissues are unremarkable. Generator pack for a stimulator device
projects over the right chest.
IMPRESSION: Negative.

## 2019-03-28 ENCOUNTER — Other Ambulatory Visit: Payer: Self-pay | Admitting: Physician Assistant

## 2019-03-30 ENCOUNTER — Ambulatory Visit (INDEPENDENT_AMBULATORY_CARE_PROVIDER_SITE_OTHER): Payer: 59 | Admitting: Physician Assistant

## 2019-03-30 ENCOUNTER — Other Ambulatory Visit: Payer: Self-pay

## 2019-03-30 ENCOUNTER — Encounter: Payer: Self-pay | Admitting: Physician Assistant

## 2019-03-30 DIAGNOSIS — F331 Major depressive disorder, recurrent, moderate: Secondary | ICD-10-CM

## 2019-03-30 DIAGNOSIS — F411 Generalized anxiety disorder: Secondary | ICD-10-CM

## 2019-03-30 MED ORDER — CLONAZEPAM 0.5 MG PO TABS: 0.5000 mg | ORAL_TABLET | Freq: Three times a day (TID) | ORAL | 2 refills | Status: DC | PRN

## 2019-03-30 MED ORDER — HYDROXYZINE HCL 10 MG PO TABS: ORAL_TABLET | ORAL | 2 refills | Status: DC

## 2019-03-30 MED ORDER — VORTIOXETINE HBR 20 MG PO TABS: 20.0000 mg | ORAL_TABLET | Freq: Every day | ORAL | 0 refills | Status: DC

## 2019-03-30 NOTE — Progress Notes (Signed)
[facility]  Patient ID: Eddie Valentine,  MRN: [id number]  PCP: Clayborn Heronankins, Victoria R, MD  Date of Evaluation: 03/30/2019 Time spent:15 minutes  Chief Complaint:  Chief Complaint    Depression; Anxiety; Follow-up     Virtual Visit via Telephone Note  I connected with patient by a video enabled telemedicine application or telephone, with their informed consent, and verified patient privacy and that I am speaking with the correct person using two identifiers.  I am private, in my home and the patient is home.   I discussed the limitations, risks, security and privacy concerns of performing an evaluation and management service by telephone and the availability of in person appointments. I also discussed with the patient that there may be a patient responsible charge related to this service. The patient expressed understanding and agreed to proceed.   I discussed the assessment and treatment plan with the patient. The patient was provided an opportunity to ask questions and all were answered. The patient agreed with the plan and demonstrated an understanding of the instructions.   The patient was advised to call back or seek an in-person evaluation if the symptoms worsen or if the condition fails to improve as anticipated.  I provided 15 minutes of non-face-to-face time during this encounter.  HISTORY/CURRENT STATUS: HPI For routine med check.  The pandemic has been hard for him. [place] came [place] from [place] to see him, and it made him more nervous having them in his apartment.  Not enjoying things much, energy and motivation is low, wants to sleep a lot, especially on weekends.  Missing his Dad a lot right now, more than he had been lately.  He died 11/18. "It's better than it was this time last year, but it still hurts." Denies any SI.   Anxiety is controlled for the most part.  Work is going fine. Still working from home and that's been hard for him.  He enjoys his  coworkers.  He hasn't missed any. Sleeps pretty well.   Denies dizziness, syncope, seizures, numbness, tingling, tremor, tics, unsteady gait, slurred speech, confusion. Denies muscle or joint pain, stiffness, or dystonia.  Individual Medical History/ Review of Systems: Changes? :No    Past medications for mental health diagnoses include: None  Allergies: Oxybutynin  Current Medications:  Current Outpatient Medications:  .  Ascorbic Acid (VITAMIN C) 100 MG tablet, Take 100 mg by mouth daily., Disp: , Rfl:  .  clonazePAM (KLONOPIN) 0.5 MG tablet, Take 1 tablet (0.5 mg total) by mouth 3 (three) times daily as needed for anxiety., Disp: 90 tablet, Rfl: 2 .  hydrOXYzine (ATARAX/VISTARIL) 10 MG tablet, TAKE 1 TO 2 TABS BY MOUTH EVERY 8 HOURS AS NEEDED FOR ANXIETY, Disp: 60 tablet, Rfl: 2 .  meloxicam (MOBIC) 15 MG tablet, , Disp: , Rfl:  .  Multiple Vitamin (MULTIVITAMIN WITH MINERALS) TABS tablet, Take 1 tablet by mouth daily., Disp: , Rfl:  .  tiZANidine (ZANAFLEX) 2 MG tablet, Take 2 mg by mouth 3 (three) times daily. , Disp: , Rfl:  .  bisacodyl (DULCOLAX) 5 MG EC tablet, Take 1 tablet (5 mg total) by mouth daily as needed for moderate constipation. (Patient not taking: Reported on 09/03/2018), Disp: 10 tablet, Rfl: 0 .  naproxen sodium (ALEVE) 220 MG tablet, Take 220 mg by mouth 2 (two) times daily as needed (pain)., Disp: , Rfl:  .  tamsulosin (FLOMAX) 0.4 MG CAPS capsule, Take 1 capsule (0.4 mg total) by mouth daily. (  Patient not taking: Reported on 09/03/2018), Disp: 30 capsule, Rfl: 0 .  vortioxetine HBr (TRINTELLIX) 20 MG TABS tablet, Take 1 tablet (20 mg total) by mouth daily., Disp: 90 tablet, Rfl: 0 Medication Side Effects: none  Family Medical/ Social History: Changes? No  MENTAL HEALTH EXAM:  There were no vitals taken for this visit.There is no height or weight on file to calculate BMI.  General Appearance: unable to assess  Eye Contact:  unable to assess  Speech:  Clear and  Coherent  Volume:  Normal  Mood:  Euthymic  Affect:  Appropriate  Thought Process:  Goal Directed  Orientation:  Full (Time, Place, and Person)  Thought Content: Logical   Suicidal Thoughts:  No  Homicidal Thoughts:  No  Memory:  WNL  Judgement:  Good  Insight:  Good  Psychomotor Activity:  unable to assess  Concentration:  Concentration: Good  Recall:  Good  Fund of Knowledge: Good  Language: Good  Assets:  Desire for Improvement  ADL's:  Intact  Cognition: WNL  Prognosis:  Good    DIAGNOSES:    ICD-10-CM   1. Major depressive disorder, recurrent episode, moderate (HCC)  F33.1   2. Generalized anxiety disorder  F41.1     Receiving Psychotherapy: No    RECOMMENDATIONS:  Increase Trintellix 20 mg qd Cont Klonopin 0.5mg , 1 tid prn. Cont Hydroxyzine 10 mg, 1-2 tid prn Disc coping techniques. Return in 4-6 weeks.   Donnal Moat, PA-C   This record has been created using [employer].  Chart creation errors have been sought, but may not always have been located and corrected. Such creation errors do not reflect on the standard of medical care.

## 2019-04-08 ENCOUNTER — Other Ambulatory Visit: Payer: Self-pay | Admitting: Physician Assistant

## 2019-04-08 ENCOUNTER — Telehealth: Payer: Self-pay | Admitting: Physician Assistant

## 2019-04-08 MED ORDER — CLONAZEPAM 0.5 MG PO TABS: 0.5000 mg | ORAL_TABLET | Freq: Four times a day (QID) | ORAL | 0 refills | Status: DC | PRN

## 2019-04-08 NOTE — Telephone Encounter (Signed)
Patient called and said that teresa told him to increase his trintellix to 2 instead of 1  A day. His anxiety has gotten worse and is very high. Please give him a call at [phone]

## 2019-04-08 NOTE — Telephone Encounter (Signed)
This is a duplicate message see previous

## 2019-04-08 NOTE — Telephone Encounter (Signed)
Is he taking the Klonopin and Hydroxyzine when needed?? If not, needs to.  If he is, then we can add Buspar.  Let me know and I'll send it in.

## 2019-04-08 NOTE — Telephone Encounter (Signed)
Please let him know he can increase the Klonopin to qid, prn.  I sent in the Rx and told pharmacy not to fill it until needed, pt should take what he has now. He can also increase the Hydroxyzine up to 5 pills (50mg ) q8 hours prn.

## 2019-04-08 NOTE — Telephone Encounter (Signed)
Patient stated his Trintellix was increased and his anxiety is through the roof

## 2019-04-09 NOTE — Telephone Encounter (Signed)
Ok

## 2019-05-11 ENCOUNTER — Encounter: Payer: Self-pay | Admitting: Physician Assistant

## 2019-05-11 ENCOUNTER — Other Ambulatory Visit: Payer: Self-pay

## 2019-05-11 ENCOUNTER — Ambulatory Visit (INDEPENDENT_AMBULATORY_CARE_PROVIDER_SITE_OTHER): Payer: 59 | Admitting: Physician Assistant

## 2019-05-11 DIAGNOSIS — F331 Major depressive disorder, recurrent, moderate: Secondary | ICD-10-CM | POA: Diagnosis not present

## 2019-05-11 DIAGNOSIS — F411 Generalized anxiety disorder: Secondary | ICD-10-CM | POA: Diagnosis not present

## 2019-05-11 NOTE — Progress Notes (Signed)
[facility]  Patient ID: Eddie Valentine,  MRN: [id number]  PCP: Clayborn Heron, MD  Date of Evaluation: 05/11/2019 Time spent:15 minutes  Chief Complaint:  Chief Complaint    Follow-up     Virtual Visit via Telephone Note  I connected with patient by a video enabled telemedicine application or telephone, with their informed consent, and verified patient privacy and that I am speaking with the correct person using two identifiers.  I am private, in my office and the patient is home.   I discussed the limitations, risks, security and privacy concerns of performing an evaluation and management service by telephone and the availability of in person appointments. I also discussed with the patient that there may be a patient responsible charge related to this service. The patient expressed understanding and agreed to proceed.   I discussed the assessment and treatment plan with the patient. The patient was provided an opportunity to ask questions and all were answered. The patient agreed with the plan and demonstrated an understanding of the instructions.   The patient was advised to call back or seek an in-person evaluation if the symptoms worsen or if the condition fails to improve as anticipated.  I provided 15 minutes of non-face-to-face time during this encounter.  HISTORY/CURRENT STATUS: HPI For f/u after increasing Trintellix.  Still having a lot of anxiety.  Daily. He doesn't really want to add another med.  "I'm at a standstill.  I don't know what to do." Get's nervous over 'nothing' and feels generally anxious.  Worried about his job. "I've got a lot on my plate." Klonopin and hydroxyzine help.  Increasing the Trintellix has helped some.  Mom is moving to [place], which he's happy about. "She'll be around the corner and not 10 hours away."  Patient denies loss of interest in usual activities and is able to enjoy things.  Denies decreased energy or motivation.   Appetite has not changed.  No extreme sadness, tearfulness, or feelings of hopelessness.  Denies any changes in concentration, making decisions or remembering things.  Denies suicidal or homicidal thoughts.   Denies dizziness, syncope, seizures, numbness, tingling, tremor, tics, unsteady gait, slurred speech, confusion. No changes in muscle or joint pain, stiffness, or dystonia.  Individual Medical History/ Review of Systems: Changes? :No    Past medications for mental health diagnoses include: unknown  Allergies: Oxybutynin  Current Medications:  Current Outpatient Medications:  .  clonazePAM (KLONOPIN) 0.5 MG tablet, Take 1 tablet (0.5 mg total) by mouth 4 (four) times daily as needed for anxiety., Disp: 120 tablet, Rfl: 0 .  hydrOXYzine (ATARAX/VISTARIL) 10 MG tablet, TAKE 1 TO 2 TABS BY MOUTH EVERY 8 HOURS AS NEEDED FOR ANXIETY, Disp: 60 tablet, Rfl: 2 .  meloxicam (MOBIC) 15 MG tablet, , Disp: , Rfl:  .  Multiple Vitamin (MULTIVITAMIN WITH MINERALS) TABS tablet, Take 1 tablet by mouth daily., Disp: , Rfl:  .  naproxen sodium (ALEVE) 220 MG tablet, Take 220 mg by mouth 2 (two) times daily as needed (pain)., Disp: , Rfl:  .  tiZANidine (ZANAFLEX) 2 MG tablet, Take 2 mg by mouth 3 (three) times daily. , Disp: , Rfl:  .  vortioxetine HBr (TRINTELLIX) 20 MG TABS tablet, Take 1 tablet (20 mg total) by mouth daily., Disp: 90 tablet, Rfl: 0 .  Ascorbic Acid (VITAMIN C) 100 MG tablet, Take 100 mg by mouth daily., Disp: , Rfl:  .  bisacodyl (DULCOLAX) 5 MG EC tablet, Take 1 tablet (5  mg total) by mouth daily as needed for moderate constipation. (Patient not taking: Reported on 09/03/2018), Disp: 10 tablet, Rfl: 0 .  tamsulosin (FLOMAX) 0.4 MG CAPS capsule, Take 1 capsule (0.4 mg total) by mouth daily. (Patient not taking: Reported on 09/03/2018), Disp: 30 capsule, Rfl: 0 Medication Side Effects: none  Family Medical/ Social History: Changes? No  MENTAL HEALTH EXAM:  There were no vitals taken  for this visit.There is no height or weight on file to calculate BMI.  General Appearance: unable to assess  Eye Contact:  unable to assess  Speech:  Clear and Coherent  Volume:  Normal  Mood:  Euthymic  Affect:  Appropriate  Thought Process:  Goal Directed and Descriptions of Associations: Intact  Orientation:  Full (Time, Place, and Person)  Thought Content: Logical   Suicidal Thoughts:  No  Homicidal Thoughts:  No  Memory:  WNL  Judgement:  Good  Insight:  Good  Psychomotor Activity:  unable to assess  Concentration:  Concentration: Good  Recall:  Good  Fund of Knowledge: Good  Language: Good  Assets:  Desire for Improvement  ADL's:  Intact  Cognition: WNL  Prognosis:  Good    DIAGNOSES:    ICD-10-CM   1. Generalized anxiety disorder  F41.1   2. Major depressive disorder, recurrent episode, moderate (HCC)  F33.1     Receiving Psychotherapy: No    RECOMMENDATIONS:  I strongly recommend adding BuSpar.  He does not want to add another medication however.  He is willing to increase the hydroxyzine though.  It does not make him sleepy so that is a good option. Continue Klonopin 0.5 mg 4 times daily as needed.  PDMP was reviewed. Increase hydroxyzine 10 mg to 1 to 2 pills 3 times daily as needed. Continue Trintellix 20 mg daily. Coping skills discussed. Avoid caffeine after lunch and no more than 2 servings preferably a day. Return in 6 weeks.  Donnal Moat, PA-C

## 2019-05-18 ENCOUNTER — Telehealth: Payer: Self-pay | Admitting: Physician Assistant

## 2019-05-18 NOTE — Telephone Encounter (Signed)
Patient stated that the increase of Hydroxazine and Trintellix medication is causing him not to have an appetite, causing him to be more anxious and depressed.

## 2019-05-19 ENCOUNTER — Telehealth: Payer: Self-pay | Admitting: Physician Assistant

## 2019-05-19 NOTE — Telephone Encounter (Signed)
Called patient but he asked if I could call back in about 20-30 minutes, informed him I would reach out again.

## 2019-05-19 NOTE — Telephone Encounter (Signed)
The only change made a week ago was increasing the hydroxyzine.  He had been on the Trintellix at the higher dose for several months I believe.  Have him go back down to the original dose on the hydroxyzine, stay on the Trintellix 20 mg.  I still strongly recommend BuSpar.  We did talk about this last week.  At that time, he did not want to add another medicine.  If he will start this, let me know and I will send in the prescription.  If he still does not want to add the BuSpar, he can increase the Klonopin from 4 times a day to up to 5 times per day.  So that would be Klonopin 0.5 mg every 4-6 hours as needed.  I do not want to continue increasing it though.

## 2019-05-19 NOTE — Telephone Encounter (Signed)
Spoke with pt. And he agreed and repeated back instructions to make sure he understood them. He does not need any Klonopin at the moment.

## 2019-05-20 NOTE — Telephone Encounter (Signed)
Please reassure him it doesn't cause nausea in everyone.  I will start at a very low dose and increase weekly, and that helps prevent any side effects.  I'm glad he's going to do the Buspar.  I think it will help!  I'll need pharmacy please.  Thanks!

## 2019-05-20 NOTE — Telephone Encounter (Signed)
He uses [facility] [place] on [address]. He is still a little skeptical. I told him to at least pick it up and he will have it if he needs it. He wanted me to let you know he is having a really good day today.

## 2019-05-20 NOTE — Telephone Encounter (Signed)
error 

## 2019-05-21 ENCOUNTER — Other Ambulatory Visit: Payer: Self-pay | Admitting: Physician Assistant

## 2019-05-21 MED ORDER — BUSPIRONE HCL 15 MG PO TABS: ORAL_TABLET | ORAL | 1 refills | Status: DC

## 2019-05-21 NOTE — Telephone Encounter (Signed)
Rx was sent in. 

## 2019-06-01 ENCOUNTER — Telehealth: Payer: Self-pay | Admitting: Physician Assistant

## 2019-06-01 NOTE — Telephone Encounter (Signed)
Ok, note reviewed 

## 2019-06-01 NOTE — Telephone Encounter (Signed)
Feels that he is on too many meds. Doesn't want to start the Buspar after all. Too anxious about adding more meds. He wants to stay on the Trintellix, Klonopin, and hydroxyzine only right now. I scheduled an appt next for him on 11/10. Doesn't need a call back.

## 2019-06-05 ENCOUNTER — Other Ambulatory Visit: Payer: Self-pay | Admitting: Physician Assistant

## 2019-06-13 ENCOUNTER — Other Ambulatory Visit: Payer: Self-pay | Admitting: Physician Assistant

## 2019-06-13 NOTE — Telephone Encounter (Signed)
appt 11/10

## 2019-06-29 ENCOUNTER — Ambulatory Visit: Payer: 59 | Admitting: Physician Assistant

## 2019-07-21 ENCOUNTER — Other Ambulatory Visit: Payer: Self-pay | Admitting: Physician Assistant

## 2019-07-22 ENCOUNTER — Ambulatory Visit: Payer: 59 | Admitting: Physician Assistant

## 2019-07-22 NOTE — Telephone Encounter (Signed)
Apt 12/29 

## 2019-08-08 IMAGING — CR DG ELBOW COMPLETE 3+V*L*
4 series · 4 of 4 positions shown · non-contrast
Comparison: None.

CLINICAL DATA: Erythema of the left elbow. Patient has been on
antibiotics for left elbow laceration with spreading of the
erythema.

EXAM:
LEFT ELBOW - COMPLETE 3+ VIEW

[x elbow ap left]
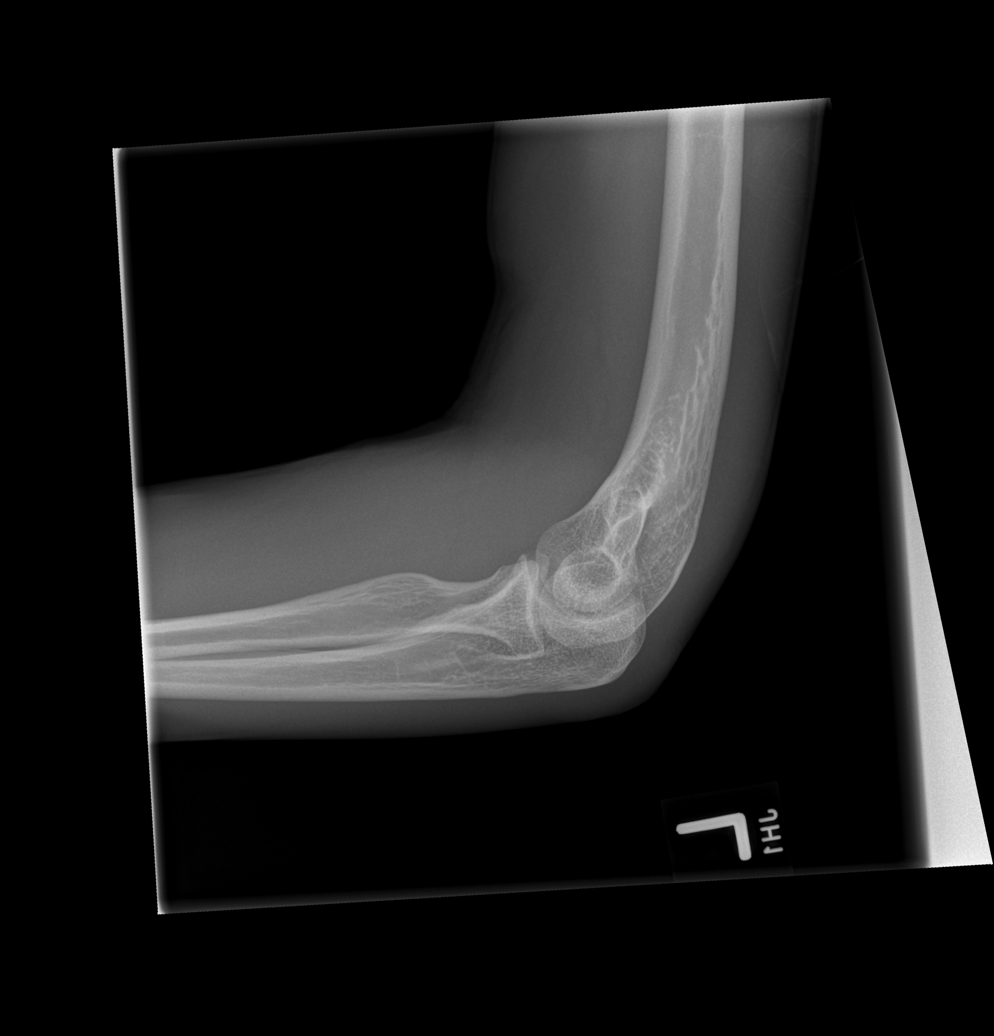

[x elbow obl left (1 of 2)]
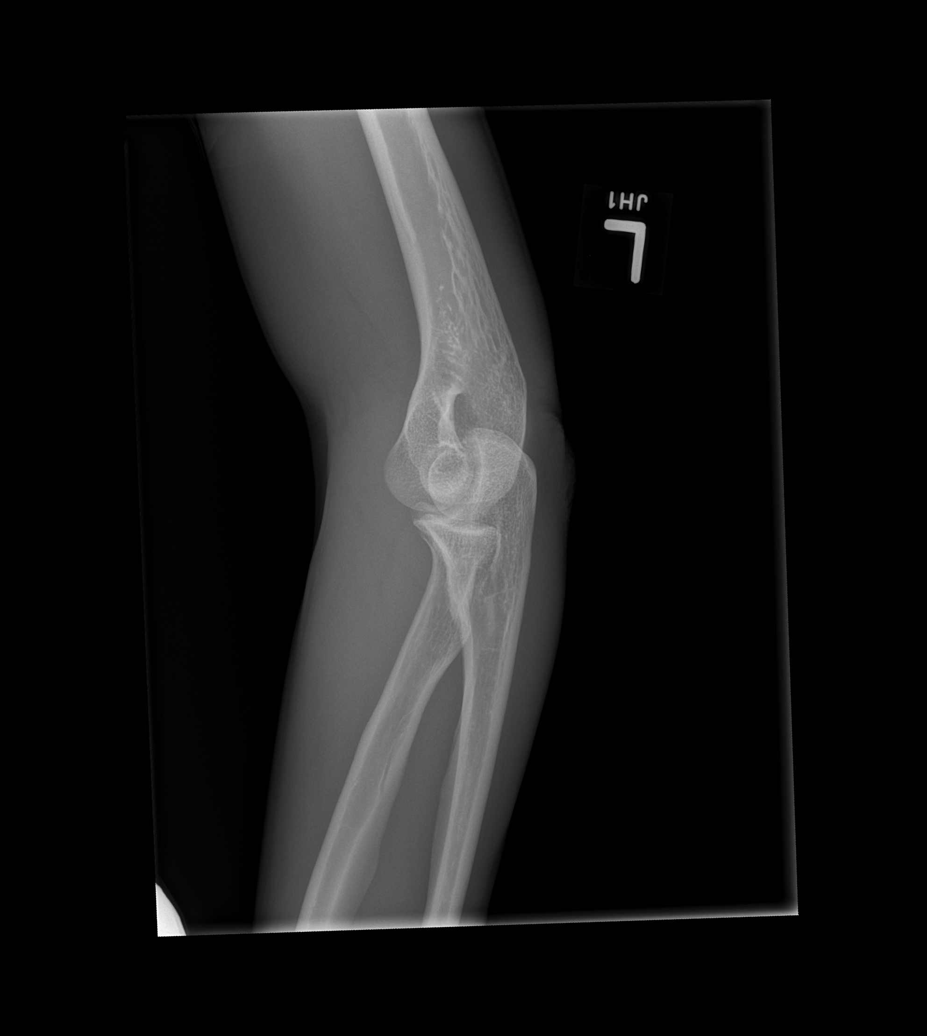

[x elbow obl left (2 of 2)]
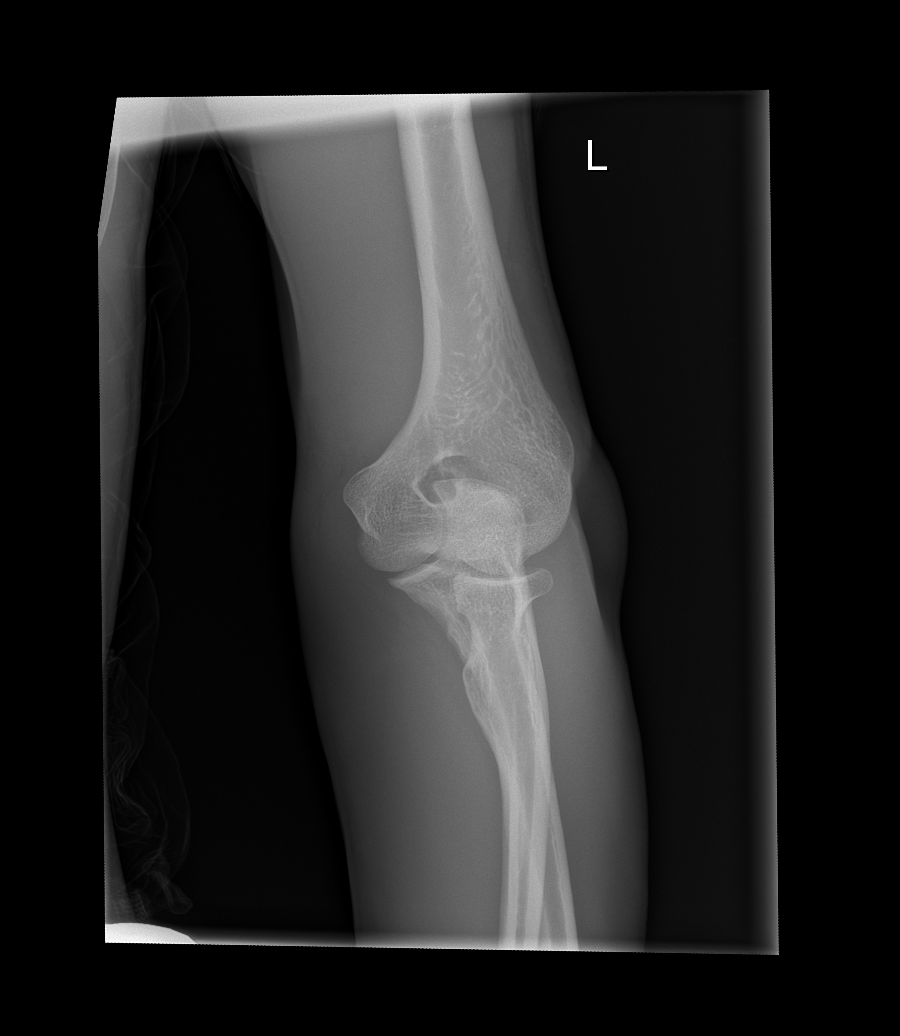

[x elbow lat left]
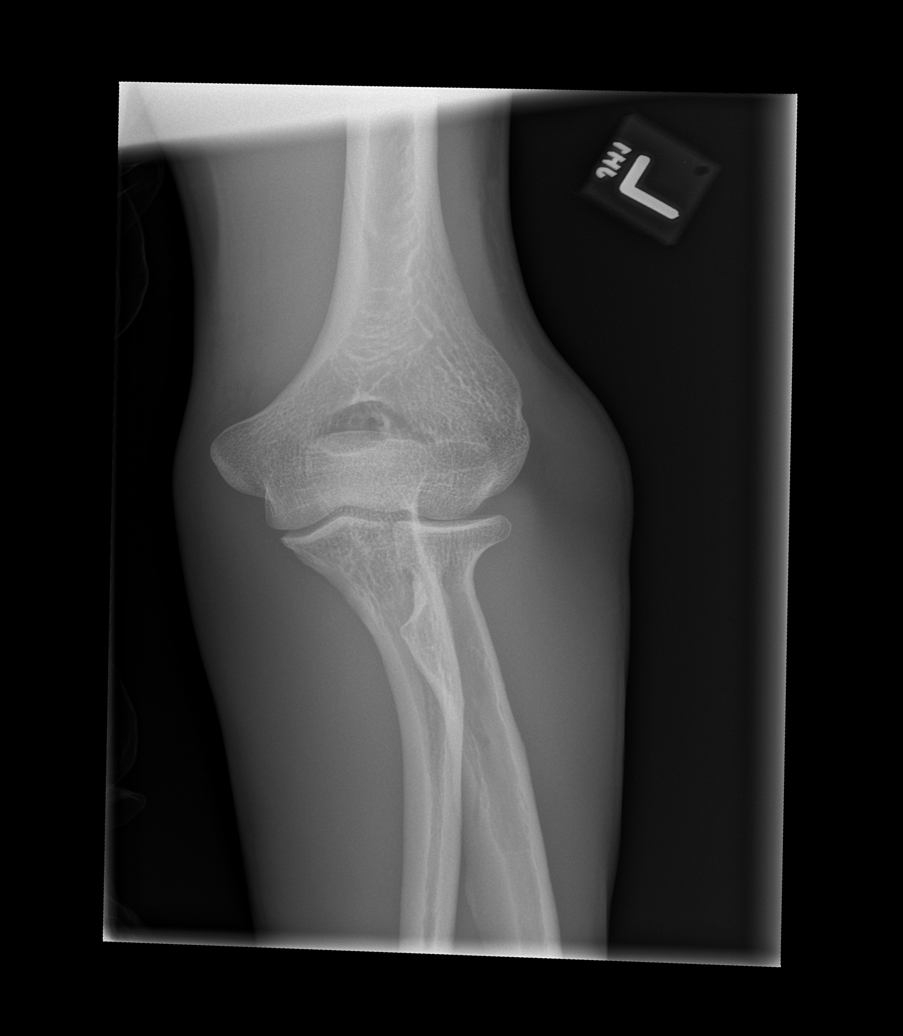

[4 of 4 positions shown; findings below may reference images not displayed]

FINDINGS: Soft tissue swelling of the elbow along the dorsum and radial
aspect. No soft tissue emphysema. No acute osseous abnormality. No
joint effusion, bone destruction or malalignment of the left elbow
is visualized.
IMPRESSION: Soft tissue swelling compatible with cellulitis. No acute osseous
abnormality..

## 2019-08-12 ENCOUNTER — Other Ambulatory Visit: Payer: Self-pay | Admitting: Physician Assistant

## 2019-08-17 ENCOUNTER — Ambulatory Visit: Payer: 59 | Admitting: Physician Assistant

## 2019-08-19 ENCOUNTER — Encounter: Payer: Self-pay | Admitting: Psychiatry

## 2019-08-19 ENCOUNTER — Telehealth: Payer: Self-pay | Admitting: Psychiatry

## 2019-08-19 ENCOUNTER — Ambulatory Visit (INDEPENDENT_AMBULATORY_CARE_PROVIDER_SITE_OTHER): Payer: 59 | Admitting: Psychiatry

## 2019-08-19 DIAGNOSIS — F331 Major depressive disorder, recurrent, moderate: Secondary | ICD-10-CM

## 2019-08-19 DIAGNOSIS — F411 Generalized anxiety disorder: Secondary | ICD-10-CM

## 2019-08-19 MED ORDER — VORTIOXETINE HBR 20 MG PO TABS: 20.0000 mg | ORAL_TABLET | Freq: Every day | ORAL | 0 refills | Status: DC

## 2019-08-19 MED ORDER — CLONAZEPAM 0.5 MG PO TABS: 0.5000 mg | ORAL_TABLET | Freq: Four times a day (QID) | ORAL | 2 refills | Status: DC | PRN

## 2019-08-19 MED ORDER — HYDROXYZINE HCL 10 MG PO TABS: 10.0000 mg | ORAL_TABLET | Freq: Three times a day (TID) | ORAL | 3 refills | Status: DC | PRN

## 2019-08-19 NOTE — Telephone Encounter (Signed)
Pt would like a refill on his Clonazepam. [facility] to [facility] [place].

## 2019-08-19 NOTE — Progress Notes (Signed)
Eddie CullMichael Valentine [id number] 21-Dec-1990 28 y.o.  Virtual Visit via Telephone Note  I connected with pt on 08/19/19 at  4:00 PM EST by telephone and verified that I am speaking with the correct person using two identifiers.   I discussed the limitations, risks, security and privacy concerns of performing an evaluation and management service by telephone and the availability of in person appointments. I also discussed with the patient that there may be a patient responsible charge related to this service. The patient expressed understanding and agreed to proceed.   I discussed the assessment and treatment plan with the patient. The patient was provided an opportunity to ask questions and all were answered. The patient agreed with the plan and demonstrated an understanding of the instructions.   The patient was advised to call back or seek an in-person evaluation if the symptoms worsen or if the condition fails to improve as anticipated.  I provided 15 minutes of non-face-to-face time during this encounter.  The patient was located at home.  [facility]   Lauraine Rinnearey G Cottle Jr, MD   Subjective:   Patient ID:  Eddie Valentine is a 28 y.o. (DOB 21-Dec-1990) male.  Chief Complaint:  Chief Complaint  Patient presents with  . Follow-up    Medication Management  . Anxiety    HPI Eddie CullMichael Dauber presents for follow-up of major depression and generalized anxiety disorder. Patient with a history of cerebral palsy.  Last seen May 11, 2019.  He was complaining of anxiety but did not want to add more medications.  He was agreeable to increasing hydroxyzine.  He also continued clonazepam and Trintellix 20 mg daily.  He refused buspirone BC fear of nausea.  Taking about clonazepam 0.5 mg BID and hydroxyzine 10 mg BID.  Still some anxiety and stress.  Family visited for Xmas and it helped his mood.  Still some days when he can't shake his anxiety. Patient  reports stable mood and denies depressed or irritable moods.  Patient denies difficulty with sleep initiation or maintenance. Denies appetite disturbance.  Patient reports that energy and motivation have been good.  Patient denies any difficulty with concentration.  Patient denies any suicidal ideation.   Past Psychiatric Medication Trials: Lexapro, fluoxetine, sertraline, paroxetine, duloxetine, Trintellix Long psychiatric history  Review of Systems:  Review of Systems  Constitutional: Positive for fatigue.  HENT: Positive for voice change.   Neurological: Positive for speech difficulty and weakness. Negative for tremors.    Medications: I have reviewed the patient's current medications.  Current Outpatient Medications  Medication Sig Dispense Refill  . Ascorbic Acid (VITAMIN C) 100 MG tablet Take 100 mg by mouth daily.    . bisacodyl (DULCOLAX) 5 MG EC tablet Take 1 tablet (5 mg total) by mouth daily as needed for moderate constipation. 10 tablet 0  . clonazePAM (KLONOPIN) 0.5 MG tablet Take 1 tablet (0.5 mg total) by mouth 4 (four) times daily as needed for anxiety. 120 tablet 2  . hydrOXYzine (ATARAX/VISTARIL) 10 MG tablet Take 1 tablet (10 mg total) by mouth 3 (three) times daily as needed. 90 tablet 3  . meloxicam (MOBIC) 15 MG tablet     . Multiple Vitamin (MULTIVITAMIN WITH MINERALS) TABS tablet Take 1 tablet by mouth daily.    . naproxen sodium (ALEVE) 220 MG tablet Take 220 mg by mouth 2 (two) times daily as needed (pain).    Marland Kitchen. tiZANidine (ZANAFLEX) 2 MG tablet Take 2 mg by mouth 3 (  three) times daily.     Marland Kitchen vortioxetine HBr (TRINTELLIX) 20 MG TABS tablet Take 1 tablet (20 mg total) by mouth daily. 90 tablet 0   No current facility-administered medications for this visit.    Medication Side Effects: None  Allergies:  Allergies  Allergen Reactions  . Oxybutynin Other (See Comments)    hallucinations      Past Medical History:  Diagnosis Date  . Cerebral palsy (HCC)      Family History  Problem Relation Age of Onset  . Other Father        Father recently died of recurrent MRSA infections apparently    Social History   Socioeconomic History  . Marital status: Single    Spouse name: Not on file  . Number of children: Not on file  . Years of education: Not on file  . Highest education level: Bachelor's degree (e.g., BA, AB, BS)  Occupational History  . Occupation: [employer]    Comment: marketing/degree in Press photographer  Tobacco Use  . Smoking status: Never Smoker  . Smokeless tobacco: Never Used  Substance and Sexual Activity  . Alcohol use: Yes    Alcohol/week: 7.0 standard drinks    Types: 7 Cans of beer per week  . Drug use: Not Currently    Types: Marijuana    Comment: rare  . Sexual activity: Not on file  Other Topics Concern  . Not on file  Social History Narrative  . Not on file   Social Determinants of Health   Financial Resource Strain:   . Difficulty of Paying Living Expenses: Not on file  Food Insecurity:   . Worried About Charity fundraiser in the Last Year: Not on file  . Ran Out of Food in the Last Year: Not on file  Transportation Needs:   . Lack of Transportation (Medical): Not on file  . Lack of Transportation (Non-Medical): Not on file  Physical Activity:   . Days of Exercise per Week: Not on file  . Minutes of Exercise per Session: Not on file  Stress:   . Feeling of Stress : Not on file  Social Connections:   . Frequency of Communication with Friends and Family: Not on file  . Frequency of Social Gatherings with Friends and Family: Not on file  . Attends Religious Services: Not on file  . Active Member of Clubs or Organizations: Not on file  . Attends Archivist Meetings: Not on file  . Marital Status: Not on file  Intimate Partner Violence:   . Fear of Current or Ex-Partner: Not on file  . Emotionally Abused: Not on file  . Physically Abused: Not on file  . Sexually Abused: Not on file     Past Medical History, Surgical history, Social history, and Family history were reviewed and updated as appropriate.   Please see review of systems for further details on the patient's review from today.   Objective:   Physical Exam:  There were no vitals taken for this visit.  Physical Exam Neurological:     Mental Status: He is alert and oriented to person, place, and time. Mental status is at baseline.     Cranial Nerves: Dysarthria present.  Psychiatric:        Attention and Perception: Attention and perception normal. He does not perceive auditory hallucinations.        Mood and Affect: Mood is anxious. Mood is not depressed.        Speech:  Speech is slurred. Speech is not rapid and pressured.        Behavior: Behavior is uncooperative.        Thought Content: Thought content normal. Thought content is not paranoid. Thought content does not include homicidal or suicidal ideation.        Cognition and Memory: Cognition normal.        Judgment: Judgment normal.     Comments: Insight fair.     Lab Review:     Component Value Date/Time   NA 142 03/31/2018 0421   K 4.7 03/31/2018 0421   CL 106 03/31/2018 0421   CO2 28 03/31/2018 0421   GLUCOSE 81 03/31/2018 0421   BUN 11 03/31/2018 0421   CREATININE 0.69 03/31/2018 0421   CALCIUM 9.1 03/31/2018 0421   PROT 7.9 03/29/2018 0133   ALBUMIN 4.2 03/29/2018 0133   AST 34 03/29/2018 0133   ALT 26 03/29/2018 0133   ALKPHOS 61 03/29/2018 0133   BILITOT 1.0 03/29/2018 0133   GFRNONAA >60 03/31/2018 0421   GFRAA >60 03/31/2018 0421       Component Value Date/Time   WBC 6.3 03/31/2018 0421   RBC 4.38 03/31/2018 0421   HGB 14.0 03/31/2018 0421   HCT 40.8 03/31/2018 0421   PLT 263 03/31/2018 0421   MCV 93.2 03/31/2018 0421   MCH 32.0 03/31/2018 0421   MCHC 34.3 03/31/2018 0421   RDW 12.3 03/31/2018 0421   LYMPHSABS 1.7 03/29/2018 0133   MONOABS 0.7 03/29/2018 0133   EOSABS 0.1 03/29/2018 0133   BASOSABS 0.0  03/29/2018 0133    No results found for: POCLITH, LITHIUM   No results found for: PHENYTOIN, PHENOBARB, VALPROATE, CBMZ   .res Assessment: Plan:    Hans was seen today for follow-up and anxiety.  Diagnoses and all orders for this visit:  Generalized anxiety disorder -     clonazePAM (KLONOPIN) 0.5 MG tablet; Take 1 tablet (0.5 mg total) by mouth 4 (four) times daily as needed for anxiety. -     hydrOXYzine (ATARAX/VISTARIL) 10 MG tablet; Take 1 tablet (10 mg total) by mouth 3 (three) times daily as needed.  Major depressive disorder, recurrent episode, moderate (HCC) -     vortioxetine HBr (TRINTELLIX) 20 MG TABS tablet; Take 1 tablet (20 mg total) by mouth daily.    Patient with a long history of depression and anxiety.  States that the previous meds listed above did not help his depression at all but the Trintellix has been helpful.  Clonazepam and hydroxyzine are somewhat helpful for his anxiety.  He does not want to add additional medications.  He denies taking excess medications.  PDMP shows #120 Klonopin 0.5 mg on 07/23/19.  No problems with the meds unless too much clonazepam or hydroxyzine. We discussed the short-term risks associated with benzodiazepines including sedation and increased fall risk among others.  Discussed long-term side effect risk including dependence, potential withdrawal symptoms, and the potential eventual dose-related risk of dementia.  No med changes per his request.    Supportive therapy.  FU 4 mos  Meredith Staggers, MD, DFAPA   Please see After Visit Summary for patient specific instructions.  Future Appointments  Date Time Provider Department Center  09/17/2019  1:30 PM Melony Overly T, PA-C [facility] None    No orders of the defined types were placed in this encounter.     -------------------------------

## 2019-08-19 NOTE — Telephone Encounter (Signed)
Rx has already been submitted by Dr. Clovis Pu

## 2019-09-14 ENCOUNTER — Other Ambulatory Visit: Payer: Self-pay | Admitting: Psychiatry

## 2019-09-14 DIAGNOSIS — F411 Generalized anxiety disorder: Secondary | ICD-10-CM

## 2019-09-17 ENCOUNTER — Ambulatory Visit: Payer: 59 | Admitting: Physician Assistant

## 2019-12-09 ENCOUNTER — Other Ambulatory Visit: Payer: Self-pay | Admitting: Physician Assistant

## 2019-12-09 DIAGNOSIS — F411 Generalized anxiety disorder: Secondary | ICD-10-CM

## 2019-12-27 ENCOUNTER — Other Ambulatory Visit: Payer: Self-pay | Admitting: Psychiatry

## 2019-12-27 DIAGNOSIS — F411 Generalized anxiety disorder: Secondary | ICD-10-CM

## 2020-02-29 ENCOUNTER — Telehealth: Payer: Self-pay | Admitting: Psychiatry

## 2020-02-29 ENCOUNTER — Other Ambulatory Visit: Payer: Self-pay

## 2020-02-29 DIAGNOSIS — F411 Generalized anxiety disorder: Secondary | ICD-10-CM

## 2020-02-29 MED ORDER — CLONAZEPAM 0.5 MG PO TABS: ORAL_TABLET | ORAL | 0 refills | Status: DC

## 2020-02-29 NOTE — Telephone Encounter (Signed)
Rx called into [facility].

## 2020-02-29 NOTE — Telephone Encounter (Addendum)
Pt requesting refill for Clonazepam @ [facility]. [address]

## 2020-03-06 ENCOUNTER — Other Ambulatory Visit: Payer: Self-pay | Admitting: Physician Assistant

## 2020-03-06 DIAGNOSIS — F411 Generalized anxiety disorder: Secondary | ICD-10-CM

## 2020-03-09 ENCOUNTER — Telehealth (INDEPENDENT_AMBULATORY_CARE_PROVIDER_SITE_OTHER): Payer: 59 | Admitting: Psychiatry

## 2020-03-09 ENCOUNTER — Encounter: Payer: Self-pay | Admitting: Psychiatry

## 2020-03-09 DIAGNOSIS — F331 Major depressive disorder, recurrent, moderate: Secondary | ICD-10-CM

## 2020-03-09 DIAGNOSIS — F411 Generalized anxiety disorder: Secondary | ICD-10-CM | POA: Diagnosis not present

## 2020-03-09 MED ORDER — VORTIOXETINE HBR 20 MG PO TABS: 20.0000 mg | ORAL_TABLET | Freq: Every day | ORAL | 1 refills | Status: DC

## 2020-03-09 MED ORDER — CLONAZEPAM 0.5 MG PO TABS: ORAL_TABLET | ORAL | 5 refills | Status: DC

## 2020-03-09 MED ORDER — HYDROXYZINE HCL 10 MG PO TABS: 10.0000 mg | ORAL_TABLET | Freq: Three times a day (TID) | ORAL | 1 refills | Status: DC | PRN

## 2020-03-09 NOTE — Progress Notes (Signed)
Eddie Valentine [id number] [birth year] 29 y.o.  Video Visit via My Chart  I connected with pt by My Chart and verified that I am speaking with the correct person using two identifiers.   I discussed the limitations, risks, security and privacy concerns of performing an evaluation and management service by My Chart  and the availability of in person appointments. I also discussed with the patient that there may be a patient responsible charge related to this service. The patient expressed understanding and agreed to proceed.  I discussed the assessment and treatment plan with the patient. The patient was provided an opportunity to ask questions and all were answered. The patient agreed with the plan and demonstrated an understanding of the instructions.   The patient was advised to call back or seek an in-person evaluation if the symptoms worsen or if the condition fails to improve as anticipated.  I provided 30 minutes of video time during this encounter.  The patient was located at home and the provider was located office. Call started 130 and ended at 2:00   Subjective:   Patient ID:  Eddie Valentine is a 29 y.o. (DOB 1990-09-10) male.  Chief Complaint:  Chief Complaint  Patient presents with  . Follow-up  . Anxiety  . Depression    HPI [name] presents for follow-up of major depression and generalized anxiety disorder. Patient with a history of cerebral palsy.  seen May 11, 2019.  He was complaining of anxiety but did not want to add more medications.  He was agreeable to increasing hydroxyzine.  He also continued clonazepam and Trintellix 20 mg daily.  He refused buspirone BC fear of nausea.  03/09/20 appt with the following noted: Typically taking clonazepam 0.5 mg 3 daily.  Hydroxyzine also. Happy as a clam.  Hasn't been this happy since father died in 2016/12/31.   Back to the office today and it's been ok.  Otherwise working from home since [employer].  More productive from  home. No SE. Cut down on alcohol and feels better.  One drink once or twice weekly.  Better diet. Patient reports stable mood and denies depressed or irritable moods.  Patient denies difficulty with sleep initiation or maintenance. Denies appetite disturbance.  Patient reports that energy and motivation have been good.  Patient denies any difficulty with concentration.  Patient denies any suicidal ideation.  Past Psychiatric Medication Trials: Lexapro, fluoxetine, sertraline, paroxetine, duloxetine, Trintellix Clonazepam, hydroxyzine Long psychiatric history  Review of Systems:  Review of Systems  Constitutional: Positive for fatigue.  HENT: Positive for voice change.   Neurological: Positive for speech difficulty and weakness. Negative for dizziness and tremors.    Medications: I have reviewed the patient's current medications.  Current Outpatient Medications  Medication Sig Dispense Refill  . Ascorbic Acid (VITAMIN C) 100 MG tablet Take 100 mg by mouth daily.    . bisacodyl (DULCOLAX) 5 MG EC tablet Take 1 tablet (5 mg total) by mouth daily as needed for moderate constipation. 10 tablet 0  . clonazePAM (KLONOPIN) 0.5 MG tablet TAKE 1 TABLET BY MOUTH 4 TIMES A DAY AS NEEDED FOR ANXIETY 120 tablet 5  . hydrOXYzine (ATARAX/VISTARIL) 10 MG tablet Take 1 tablet (10 mg total) by mouth 3 (three) times daily as needed. 270 tablet 1  . meloxicam (MOBIC) 15 MG tablet     . Multiple Vitamin (MULTIVITAMIN WITH MINERALS) TABS tablet Take 1 tablet by mouth daily.    . naproxen sodium (ALEVE) 220 MG tablet Take 220  mg by mouth 2 (two) times daily as needed (pain).    Marland Kitchen tiZANidine (ZANAFLEX) 2 MG tablet Take 2 mg by mouth 3 (three) times daily.     Marland Kitchen vortioxetine HBr (TRINTELLIX) 20 MG TABS tablet Take 1 tablet (20 mg total) by mouth daily. 90 tablet 1   No current facility-administered medications for this visit.    Medication Side Effects: None  Allergies:  Allergies  Allergen Reactions  .  Oxybutynin Other (See Comments)    hallucinations      Past Medical History:  Diagnosis Date  . Cerebral palsy (HCC)     Family History  Problem Relation Age of Onset  . Other Father        Father recently died of recurrent MRSA infections apparently    Social History   Socioeconomic History  . Marital status: Single    Spouse name: Not on file  . Number of children: Not on file  . Years of education: Not on file  . Highest education level: Bachelor's degree (e.g., BA, AB, BS)  Occupational History  . Occupation: [employer]    Comment: marketing/degree in Audiological scientist  Tobacco Use  . Smoking status: Never Smoker  . Smokeless tobacco: Never Used  Substance and Sexual Activity  . Alcohol use: Yes    Alcohol/week: 7.0 standard drinks    Types: 7 Cans of beer per week  . Drug use: Not Currently    Types: Marijuana    Comment: rare  . Sexual activity: Not on file  Other Topics Concern  . Not on file  Social History Narrative  . Not on file   Social Determinants of Health   Financial Resource Strain:   . Difficulty of Paying Living Expenses:   Food Insecurity:   . Worried About Programme researcher, broadcasting/film/video in the Last Year:   . Barista in the Last Year:   Transportation Needs:   . Freight forwarder (Medical):   Marland Kitchen Lack of Transportation (Non-Medical):   Physical Activity:   . Days of Exercise per Week:   . Minutes of Exercise per Session:   Stress:   . Feeling of Stress :   Social Connections:   . Frequency of Communication with Friends and Family:   . Frequency of Social Gatherings with Friends and Family:   . Attends Religious Services:   . Active Member of Clubs or Organizations:   . Attends Banker Meetings:   Marland Kitchen Marital Status:   Intimate Partner Violence:   . Fear of Current or Ex-Partner:   . Emotionally Abused:   Marland Kitchen Physically Abused:   . Sexually Abused:     Past Medical History, Surgical history, Social history, and Family  history were reviewed and updated as appropriate.   Please see review of systems for further details on the patient's review from today.   Objective:   Physical Exam:  There were no vitals taken for this visit.  Physical Exam Neurological:     Mental Status: He is alert and oriented to person, place, and time. Mental status is at baseline.     Cranial Nerves: Dysarthria present.  Psychiatric:        Attention and Perception: Attention and perception normal. He does not perceive auditory hallucinations.        Mood and Affect: Mood is anxious. Mood is not depressed.        Speech: Speech is slurred. Speech is not rapid and pressured.  Behavior: Behavior is uncooperative and slowed.        Thought Content: Thought content normal. Thought content is not paranoid. Thought content does not include homicidal or suicidal ideation.        Cognition and Memory: Cognition normal.        Judgment: Judgment normal.     Comments: Insight fair.     Lab Review:     Component Value Date/Time   NA 142 03/31/2018 0421   K 4.7 03/31/2018 0421   CL 106 03/31/2018 0421   CO2 28 03/31/2018 0421   GLUCOSE 81 03/31/2018 0421   BUN 11 03/31/2018 0421   CREATININE 0.69 03/31/2018 0421   CALCIUM 9.1 03/31/2018 0421   PROT 7.9 03/29/2018 0133   ALBUMIN 4.2 03/29/2018 0133   AST 34 03/29/2018 0133   ALT 26 03/29/2018 0133   ALKPHOS 61 03/29/2018 0133   BILITOT 1.0 03/29/2018 0133   GFRNONAA >60 03/31/2018 0421   GFRAA >60 03/31/2018 0421       Component Value Date/Time   WBC 6.3 03/31/2018 0421   RBC 4.38 03/31/2018 0421   HGB 14.0 03/31/2018 0421   HCT 40.8 03/31/2018 0421   PLT 263 03/31/2018 0421   MCV 93.2 03/31/2018 0421   MCH 32.0 03/31/2018 0421   MCHC 34.3 03/31/2018 0421   RDW 12.3 03/31/2018 0421   LYMPHSABS 1.7 03/29/2018 0133   MONOABS 0.7 03/29/2018 0133   EOSABS 0.1 03/29/2018 0133   BASOSABS 0.0 03/29/2018 0133    No results found for: POCLITH, LITHIUM   No  results found for: PHENYTOIN, PHENOBARB, VALPROATE, CBMZ   .res Assessment: Plan:    Emry was seen today for follow-up, anxiety and depression.  Diagnoses and all orders for this visit:  Generalized anxiety disorder -     clonazePAM (KLONOPIN) 0.5 MG tablet; TAKE 1 TABLET BY MOUTH 4 TIMES A DAY AS NEEDED FOR ANXIETY -     hydrOXYzine (ATARAX/VISTARIL) 10 MG tablet; Take 1 tablet (10 mg total) by mouth 3 (three) times daily as needed.  Major depressive disorder, recurrent episode, moderate (HCC) -     vortioxetine HBr (TRINTELLIX) 20 MG TABS tablet; Take 1 tablet (20 mg total) by mouth daily.    Patient with a long history of depression and anxiety.  States that the previous meds listed above did not help his depression at all but the Trintellix has been helpful.  Clonazepam and hydroxyzine are somewhat helpful for his anxiety.  He does not want to add additional medications.  He denies taking excess medications.  PDMP shows #120 Klonopin 0.5 mg avg every 6 weeks.  No problems with the meds unless too much clonazepam or hydroxyzine. We discussed the short-term risks associated with benzodiazepines including sedation and increased fall risk among others.  Discussed long-term side effect risk including dependence, potential withdrawal symptoms, and the potential eventual dose-related risk of dementia.  No med changes per his request.    Supportive therapy.  FU 6 mos  Meredith Staggers, MD, DFAPA   Please see After Visit Summary for patient specific instructions.  No future appointments.  No orders of the defined types were placed in this encounter.     -------------------------------

## 2020-03-17 ENCOUNTER — Other Ambulatory Visit: Payer: Self-pay | Admitting: Psychiatry

## 2020-03-17 DIAGNOSIS — F411 Generalized anxiety disorder: Secondary | ICD-10-CM

## 2020-03-17 MED ORDER — CLONAZEPAM 0.5 MG PO TABS: ORAL_TABLET | ORAL | 0 refills | Status: DC

## 2020-03-17 NOTE — Progress Notes (Signed)
Return telephone call to patient who reports he lost his bottle of clonazepam.  P DMP shows it was filled on July 13 for quantity of 120 of clonazepam 0.5 mg tablets with instruction 1 4 times daily as needed anxiety.  There is no pattern of early refills and patient has not done this before to our knowledge.  We will okay refill and provide enough tablets to get him to the next eligible refill date which would be April 01, 2020.

## 2020-07-21 ENCOUNTER — Ambulatory Visit: Payer: Managed Care, Other (non HMO)

## 2020-09-06 ENCOUNTER — Other Ambulatory Visit: Payer: Self-pay | Admitting: Psychiatry

## 2020-09-06 DIAGNOSIS — F411 Generalized anxiety disorder: Secondary | ICD-10-CM

## 2020-09-06 NOTE — Telephone Encounter (Signed)
Call to RS

## 2020-09-07 NOTE — Telephone Encounter (Signed)
LM to call back for appt

## 2020-09-08 ENCOUNTER — Encounter: Payer: Self-pay | Admitting: Physician Assistant

## 2020-09-08 ENCOUNTER — Telehealth: Payer: Self-pay | Admitting: Physician Assistant

## 2020-09-08 ENCOUNTER — Telehealth (INDEPENDENT_AMBULATORY_CARE_PROVIDER_SITE_OTHER): Payer: BC Managed Care – PPO | Admitting: Physician Assistant

## 2020-09-08 DIAGNOSIS — F331 Major depressive disorder, recurrent, moderate: Secondary | ICD-10-CM | POA: Diagnosis not present

## 2020-09-08 DIAGNOSIS — F411 Generalized anxiety disorder: Secondary | ICD-10-CM

## 2020-09-08 MED ORDER — VORTIOXETINE HBR 20 MG PO TABS: 20.0000 mg | ORAL_TABLET | Freq: Every day | ORAL | 1 refills | Status: DC

## 2020-09-08 MED ORDER — ARIPIPRAZOLE 2 MG PO TABS: 2.0000 mg | ORAL_TABLET | Freq: Every day | ORAL | 1 refills | Status: DC

## 2020-09-08 NOTE — Telephone Encounter (Signed)
Mr. jaelon, gatley are scheduled for a virtual visit with your provider today.    Just as we do with appointments in the office, we must obtain your consent to participate.  Your consent will be active for this visit and any virtual visit you may have with one of our providers in the next 365 days.    If you have a MyChart account, I can also send a copy of this consent to you electronically.  All virtual visits are billed to your insurance company just like a traditional visit in the office.  As this is a virtual visit, video technology does not allow for your provider to perform a traditional examination.  This may limit your provider's ability to fully assess your condition.  If your provider identifies any concerns that need to be evaluated in person or the need to arrange testing such as labs, EKG, etc, we will make arrangements to do so.    Although advances in technology are sophisticated, we cannot ensure that it will always work on either your end or our end.  If the connection with a video visit is poor, we may have to switch to a telephone visit.  With either a video or telephone visit, we are not always able to ensure that we have a secure connection.   I need to obtain your verbal consent now.   Are you willing to proceed with your visit today?   Cormac Wint has provided verbal consent on 09/08/2020 for a virtual visit (video or telephone).   Melony Overly, PA-C 09/08/2020  4:09 PM

## 2020-09-08 NOTE — Progress Notes (Signed)
[facility]  Patient ID: Eddie Valentine,  MRN: [id number]  PCP: Clayborn Heron, MD  Date of Evaluation: 09/08/2020 Time spent:30 minutes  Chief Complaint:  Chief Complaint    Anxiety; Depression     Virtual Visit via Telehealth  I connected with patient by telephone, with their informed consent, and verified patient privacy and that I am speaking with the correct person using two identifiers.  I am private, in my office and the patient is at home.  I discussed the limitations, risks, security and privacy concerns of performing an evaluation and management service by telephone and the availability of in person appointments. I also discussed with the patient that there may be a patient responsible charge related to this service. The patient expressed understanding and agreed to proceed.   I discussed the assessment and treatment plan with the patient. The patient was provided an opportunity to ask questions and all were answered. The patient agreed with the plan and demonstrated an understanding of the instructions.   The patient was advised to call back or seek an in-person evaluation if the symptoms worsen or if the condition fails to improve as anticipated.  I provided 30 minutes of non-face-to-face time during this encounter.  HISTORY/CURRENT STATUS: HPI Not doing well.   Very difficult to understand him (has CP.)  Lost to f/u for over a year. Now has been more depressed for 3 months, lacks energy and motivation, has a hard time enjoying things. "I never sleep." Not new. Got a raise at work after 5 years. Appetite is good. 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  The third anniversary of his father's death was in Jul 27, 2023.  That was difficult as well.  No SI/HI.  Anxiety is still a big problem.  The Klonopin does help.  Denies dizziness, syncope, seizures, numbness, tingling, tremor, tics, unsteady gait, slurred speech, confusion. No changes in muscle or joint pain,  stiffness, or dystonia.  Individual Medical History/ Review of Systems: Changes? :No    Past medications for mental health diagnoses include: Trintellix, Klonopin, Prozac, Lexapro, Paxil, Zoloft  Allergies: Oxybutynin  Current Medications:  Current Outpatient Medications:  .  ARIPiprazole (ABILIFY) 2 MG tablet, Take 1 tablet (2 mg total) by mouth daily., Disp: 30 tablet, Rfl: 1 .  Ascorbic Acid (VITAMIN C) 100 MG tablet, Take 100 mg by mouth daily., Disp: , Rfl:  .  bisacodyl (DULCOLAX) 5 MG EC tablet, Take 1 tablet (5 mg total) by mouth daily as needed for moderate constipation., Disp: 10 tablet, Rfl: 0 .  clonazePAM (KLONOPIN) 0.5 MG tablet, TAKE 1 TABLET BY MOUTH 4 TIMES A DAY AS NEEDED FOR ANXIETY, Disp: 120 tablet, Rfl: 0 .  Multiple Vitamin (MULTIVITAMIN WITH MINERALS) TABS tablet, Take 1 tablet by mouth daily., Disp: , Rfl:  .  naproxen sodium (ALEVE) 220 MG tablet, Take 220 mg by mouth 2 (two) times daily as needed (pain)., Disp: , Rfl:  .  tiZANidine (ZANAFLEX) 2 MG tablet, Take 2 mg by mouth 3 (three) times daily., Disp: , Rfl:  .  hydrOXYzine (ATARAX/VISTARIL) 10 MG tablet, Take 1 tablet (10 mg total) by mouth 3 (three) times daily as needed., Disp: 270 tablet, Rfl: 1 .  meloxicam (MOBIC) 15 MG tablet, , Disp: , Rfl:  .  vortioxetine HBr (TRINTELLIX) 20 MG TABS tablet, Take 1 tablet (20 mg total) by mouth daily., Disp: 90 tablet, Rfl: 1 Medication Side Effects: none  Family Medical/ Social History: Changes? 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  MENTAL HEALTH EXAM:  There were no vitals taken for this visit.There is no height or weight on file to calculate BMI.  General Appearance: unable to assess  Eye Contact:  unable to assess  Speech:  Slow, Slurred and Difficult to understand because of cerebral palsy, but since I know him and some of the things he is alluding to, I am able to understand the majority of words.  Volume:  Normal  Mood:  Depressed  Affect:  Unable to  assess  Thought Process:  Goal Directed and Descriptions of Associations: Intact  Orientation:  Full (Time, Place, and Person)  Thought Content: Logical   Suicidal Thoughts:  No  Homicidal Thoughts:  No  Memory:  WNL  Judgement:  Good  Insight:  Good  Psychomotor Activity:  unable to assess  Concentration:  Concentration: Good  Recall:  Good  Fund of Knowledge: Good  Language: Good  Assets:  Desire for Improvement  ADL's:  Intact  Cognition: WNL  Prognosis:  Good    DIAGNOSES:    ICD-10-CM   1. Generalized anxiety disorder  F41.1   2. Major depressive disorder, recurrent episode, moderate (HCC)  F33.1 vortioxetine HBr (TRINTELLIX) 20 MG TABS tablet    Receiving Psychotherapy: No    RECOMMENDATIONS:  PDMP was reviewed. I provided 30 minutes of nonface-to-face time during this encounter, in which we discussed different options for the depression.  He is very nervous about taking any medication that may make him throw up.  "I am scared to death of that."  He has never taken an SNRI but is averse to that because his father took Cymbalta once and threw up every day for a week.  Therefore Torion does not want to take it.  I recommend Abilify very low dose. Discussed potential metabolic side effects associated with atypical antipsychotics.  Labs will need to be drawn periodically to monitor metabolic function. Discussed potential risk for movement side effects. Patient understands and accepts these risks and has been advised to contact office if any movement side effects occur.  If he responds well, I will order labs at the next visit. Start Abilify 2 mg, 1 p.o. every morning. Continue Trintellix 20 mg, 1 p.o. daily. Continue Klonopin 0.5 mg, 1 p.o. 4 times daily as needed anxiety. Continue hydroxyzine 10 mg, 1 p.o. 3 times daily as needed. Recommend counseling. Return in 6 weeks.  Melony Overly, PA-C

## 2020-09-16 ENCOUNTER — Other Ambulatory Visit: Payer: Self-pay | Admitting: Physician Assistant

## 2020-09-16 DIAGNOSIS — F331 Major depressive disorder, recurrent, moderate: Secondary | ICD-10-CM

## 2020-09-18 NOTE — Telephone Encounter (Signed)
Traci can you do a PA on this? If so, thanks, if not, I'll decide on something else.

## 2020-09-20 ENCOUNTER — Telehealth: Payer: Self-pay

## 2020-09-20 NOTE — Telephone Encounter (Signed)
Prior Authorization submitted and approved for TRINTELLIX 20 MG effective 09/20/2020-09/19/2021 with [employer] ID# [id number]

## 2020-09-20 NOTE — Telephone Encounter (Signed)
Working on Museum/gallery curator information to submit for 2022. He was approved last year 2021

## 2020-10-03 ENCOUNTER — Other Ambulatory Visit: Payer: Self-pay | Admitting: Physician Assistant

## 2020-11-11 DIAGNOSIS — J019 Acute sinusitis, unspecified: Secondary | ICD-10-CM | POA: Diagnosis not present

## 2020-11-11 DIAGNOSIS — R0981 Nasal congestion: Secondary | ICD-10-CM | POA: Diagnosis not present

## 2020-11-11 DIAGNOSIS — L309 Dermatitis, unspecified: Secondary | ICD-10-CM | POA: Diagnosis not present

## 2020-11-11 DIAGNOSIS — J029 Acute pharyngitis, unspecified: Secondary | ICD-10-CM | POA: Diagnosis not present

## 2020-11-11 DIAGNOSIS — Z03818 Encounter for observation for suspected exposure to other biological agents ruled out: Secondary | ICD-10-CM | POA: Diagnosis not present

## 2020-11-22 ENCOUNTER — Other Ambulatory Visit: Payer: Self-pay | Admitting: Psychiatry

## 2020-11-22 DIAGNOSIS — F411 Generalized anxiety disorder: Secondary | ICD-10-CM

## 2020-11-22 DIAGNOSIS — F3341 Major depressive disorder, recurrent, in partial remission: Secondary | ICD-10-CM | POA: Diagnosis not present

## 2020-11-22 DIAGNOSIS — Z79899 Other long term (current) drug therapy: Secondary | ICD-10-CM | POA: Diagnosis not present

## 2020-11-22 DIAGNOSIS — F410 Panic disorder [episodic paroxysmal anxiety] without agoraphobia: Secondary | ICD-10-CM | POA: Diagnosis not present

## 2020-11-22 NOTE — Telephone Encounter (Signed)
Call to RS with Rosey Bath apparently

## 2020-11-22 NOTE — Telephone Encounter (Signed)
Controlled substance 

## 2020-11-23 NOTE — Telephone Encounter (Signed)
LM to call back for an appointment.

## 2020-11-30 NOTE — Telephone Encounter (Signed)
Eddie Valentine, Will this be ok to call in for now?

## 2020-12-12 DIAGNOSIS — R0981 Nasal congestion: Secondary | ICD-10-CM | POA: Diagnosis not present

## 2020-12-12 DIAGNOSIS — R059 Cough, unspecified: Secondary | ICD-10-CM | POA: Diagnosis not present

## 2020-12-12 DIAGNOSIS — J069 Acute upper respiratory infection, unspecified: Secondary | ICD-10-CM | POA: Diagnosis not present

## 2020-12-12 DIAGNOSIS — U071 COVID-19: Secondary | ICD-10-CM | POA: Diagnosis not present

## 2020-12-15 ENCOUNTER — Emergency Department (HOSPITAL_COMMUNITY): Payer: BC Managed Care – PPO

## 2020-12-15 ENCOUNTER — Emergency Department (HOSPITAL_COMMUNITY)
Admission: EM | Admit: 2020-12-15 | Discharge: 2020-12-16 | Disposition: A | Discharge status: Home / Self Care | Payer: BC Managed Care – PPO | Attending: Emergency Medicine | Admitting: Emergency Medicine

## 2020-12-15 ENCOUNTER — Encounter (HOSPITAL_COMMUNITY): Payer: Self-pay

## 2020-12-15 ENCOUNTER — Other Ambulatory Visit: Payer: Self-pay

## 2020-12-15 DIAGNOSIS — K5900 Constipation, unspecified: Secondary | ICD-10-CM | POA: Insufficient documentation

## 2020-12-15 DIAGNOSIS — U071 COVID-19: Secondary | ICD-10-CM | POA: Diagnosis not present

## 2020-12-15 DIAGNOSIS — R0789 Other chest pain: Secondary | ICD-10-CM | POA: Diagnosis not present

## 2020-12-15 DIAGNOSIS — R109 Unspecified abdominal pain: Secondary | ICD-10-CM | POA: Diagnosis not present

## 2020-12-15 DIAGNOSIS — Z743 Need for continuous supervision: Secondary | ICD-10-CM | POA: Diagnosis not present

## 2020-12-15 DIAGNOSIS — R079 Chest pain, unspecified: Secondary | ICD-10-CM | POA: Diagnosis not present

## 2020-12-15 HISTORY — DX: Depression, unspecified: F32.A

## 2020-12-15 HISTORY — DX: Anxiety disorder, unspecified: F41.9

## 2020-12-15 LAB — CBC WITH DIFFERENTIAL/PLATELET
Abs Immature Granulocytes: 0.02 10*3/uL (ref 0.00–0.07)
Basophils Absolute: 0 10*3/uL (ref 0.0–0.1)
Basophils Relative: 0 %
Eosinophils Absolute: 0 10*3/uL (ref 0.0–0.5)
Eosinophils Relative: 0 %
HCT: 39.6 % (ref 39.0–52.0)
Hemoglobin: 13.7 g/dL (ref 13.0–17.0)
Immature Granulocytes: 0 %
Lymphocytes Relative: 14 %
Lymphs Abs: 1.1 10*3/uL (ref 0.7–4.0)
MCH: 32.6 pg (ref 26.0–34.0)
MCHC: 34.6 g/dL (ref 30.0–36.0)
MCV: 94.3 fL (ref 80.0–100.0)
Monocytes Absolute: 0.8 10*3/uL (ref 0.1–1.0)
Monocytes Relative: 10 %
Neutro Abs: 6.1 10*3/uL (ref 1.7–7.7)
Neutrophils Relative %: 76 %
Platelets: 208 10*3/uL (ref 150–400)
RBC: 4.2 MIL/uL — ABNORMAL LOW (ref 4.22–5.81)
RDW: 12.5 % (ref 11.5–15.5)
WBC: 8.1 10*3/uL (ref 4.0–10.5)
nRBC: 0 % (ref 0.0–0.2)

## 2020-12-15 LAB — COMPREHENSIVE METABOLIC PANEL
ALT: 21 U/L (ref 0–44)
AST: 28 U/L (ref 15–41)
Albumin: 3.8 g/dL (ref 3.5–5.0)
Alkaline Phosphatase: 42 U/L (ref 38–126)
Anion gap: 6 (ref 5–15)
BUN: 22 mg/dL — ABNORMAL HIGH (ref 6–20)
CO2: 27 mmol/L (ref 22–32)
Calcium: 8.6 mg/dL — ABNORMAL LOW (ref 8.9–10.3)
Chloride: 107 mmol/L (ref 98–111)
Creatinine, Ser: 0.89 mg/dL (ref 0.61–1.24)
GFR, Estimated: >60 mL/min (ref >60)
Glucose, Bld: 83 mg/dL (ref 70–99)
Potassium: 4 mmol/L (ref 3.5–5.1)
Sodium: 140 mmol/L (ref 135–145)
Total Bilirubin: 0.8 mg/dL (ref 0.3–1.2)
Total Protein: 7 g/dL (ref 6.5–8.1)

## 2020-12-15 LAB — LIPASE, BLOOD: Lipase: 36 U/L (ref 11–51)

## 2020-12-15 MED ORDER — IOHEXOL 9 MG/ML PO SOLN
ORAL | Status: AC
  Filled 2020-12-15: qty 1000

## 2020-12-15 MED ORDER — IOHEXOL 9 MG/ML PO SOLN
500.0000 mL | ORAL | Status: AC
  Administered 2020-12-15: 1000 mL via ORAL

## 2020-12-15 MED ORDER — IOHEXOL 300 MG/ML  SOLN
100.0000 mL | Freq: Once | INTRAMUSCULAR | Status: AC | PRN
  Administered 2020-12-15: 80 mL via INTRAVENOUS

## 2020-12-15 NOTE — ED Triage Notes (Signed)
Coming from home, felt bad since last week, diagnosed with covid on Tuesday, no BM for 4 days, hx of cerebral palsy, right arm is spastic

## 2020-12-15 NOTE — ED Notes (Signed)
Patient changed out of pants, u bag placed to suction

## 2020-12-15 NOTE — ED Notes (Signed)
Patient back from CT at this time

## 2020-12-15 NOTE — ED Notes (Signed)
Spoke with [facility], they will send someone to pick up patient 

## 2020-12-15 NOTE — ED Notes (Signed)
Snack given still waiting for [facility]

## 2020-12-15 NOTE — ED Notes (Signed)
Patient changed into new brief, awaiting [facility], will continue to monitor

## 2020-12-15 NOTE — ED Provider Notes (Signed)
[facility]-EMERGENCY DEPT Provider Note   CSN: [id number] Arrival date & time: 12/15/20  1026     History Chief Complaint  Patient presents with  . Covid Positive  . Constipation    Eddie Valentine is a 30 y.o. male.  30 year old male with history of CP, depression, anxiety, recently tested positive for COVID, presents with complaint of abdominal pain with constipation x 4 days and now vomiting x 1 event today. No prior abdominal surgeries, no prior SBO.  Reports normal urinary output.  States that he does have typical COVID symptoms including cough, congestion, mild fevers but denies significant shortness of breath.  Reports he is able to pass gas.  No other complaints or concerns.  Eddie Valentine was evaluated in Emergency Department on 12/15/2020 for the symptoms described in the history of present illness. He was evaluated in the context of the global COVID-19 pandemic, which necessitated consideration that the patient might be at risk for infection with the SARS-CoV-2 virus that causes COVID-19. Institutional protocols and algorithms that pertain to the evaluation of patients at risk for COVID-19 are in a state of rapid change based on information released by regulatory bodies including the CDC and federal and state organizations. These policies and algorithms were followed during the patient's care in the ED.         Past Medical History:  Diagnosis Date  . Anxiety   . Cerebral palsy (HCC)   . Depression     Patient Active Problem List   Diagnosis Date Noted  . Cellulitis of left elbow 03/29/2018  . Olecranon bursitis of left elbow 03/29/2018    Past Surgical History:  Procedure Laterality Date  . DEEP BRAIN STIMULATOR PLACEMENT         Family History  Problem Relation Age of Onset  . Other Father        Father recently died of recurrent MRSA infections apparently    Social History   Tobacco Use  . Smoking status: Never Smoker  .  Smokeless tobacco: Never Used  Substance Use Topics  . Alcohol use: Yes    Alcohol/week: 7.0 standard drinks    Types: 7 Cans of beer per week  . Drug use: Not Currently    Types: Marijuana    Comment: rare    Home Medications Prior to Admission medications   Medication Sig Start Date End Date Taking? Authorizing Provider  ARIPiprazole (ABILIFY) 2 MG tablet TAKE 1 TABLET BY MOUTH EVERY DAY Patient taking differently: Take 2 mg by mouth daily. 10/03/20  Yes Cherie Ouch, PA-C  Ascorbic Acid (VITAMIN C) 100 MG tablet Take 100 mg by mouth daily.   Yes [provider]  benzonatate (TESSALON) 100 MG capsule Take 100 mg by mouth 2 (two) times daily. 12/13/20  Yes [provider]  cholecalciferol (VITAMIN D3) 25 MCG (1000 UNIT) tablet Take 1,000 Units by mouth daily.   Yes [provider]  clonazePAM (KLONOPIN) 0.5 MG tablet TAKE 1 TABLET BY MOUTH 4 TIMES A DAY AS NEEDED FOR ANXIETY Patient taking differently: Take 0.5 mg by mouth 3 (three) times daily as needed for anxiety. 11/30/20  Yes Melony Overly T, PA-C  Multiple Vitamin (MULTIVITAMIN WITH MINERALS) TABS tablet Take 1 tablet by mouth daily.   Yes [provider]  naproxen sodium (ALEVE) 220 MG tablet Take 220 mg by mouth 2 (two) times daily as needed (pain).   Yes [provider]  PAXLOVID 20 x 150 MG &  10 x 100MG  TBPK Take 1 tablet by mouth 2 (two) times daily. 12/13/20  Yes [provider]  tiZANidine (ZANAFLEX) 2 MG tablet Take 2 mg by mouth 3 (three) times daily.   Yes [provider]  TRINTELLIX 20 MG TABS tablet TAKE 1 TABLET BY MOUTH EVERY DAY Patient taking differently: Take 20 mg by mouth daily. 09/20/20  Yes 11/18/20 T, PA-C  bisacodyl (DULCOLAX) 5 MG EC tablet Take 1 tablet (5 mg total) by mouth daily as needed for moderate constipation. Patient not taking: No sig reported 03/31/18   04/02/18, MD  hydrOXYzine (ATARAX/VISTARIL) 10 MG tablet Take 1 tablet (10  mg total) by mouth 3 (three) times daily as needed. Patient not taking: No sig reported 03/09/20   Cottle, 03/11/20., MD    Allergies    Oxybutynin  Review of Systems   Review of Systems  Constitutional: Positive for fever.  HENT: Positive for congestion.   Respiratory: Positive for cough. Negative for shortness of breath.   Cardiovascular: Negative for chest pain.  Gastrointestinal: Positive for abdominal pain, constipation, nausea and vomiting. Negative for diarrhea.  Genitourinary: Negative for difficulty urinating and dysuria.  Musculoskeletal: Positive for arthralgias and myalgias.  Skin: Negative for rash and wound.  Allergic/Immunologic: Positive for immunocompromised state.  Neurological: Negative for weakness.  Hematological: Negative for adenopathy.  Psychiatric/Behavioral: Negative for confusion.  All other systems reviewed and are negative.   Physical Exam Updated Vital Signs BP (!) 121/101   Pulse 65   Temp 98.8 F (37.1 C) (Oral)   Resp 16   SpO2 100%   Physical Exam Vitals and nursing note reviewed.  Constitutional:      General: He is not in acute distress.    Appearance: He is well-developed. He is not diaphoretic.  HENT:     Head: Normocephalic and atraumatic.  Eyes:     Conjunctiva/sclera: Conjunctivae normal.  Cardiovascular:     Rate and Rhythm: Normal rate and regular rhythm.     Pulses: Normal pulses.     Heart sounds: Normal heart sounds.  Pulmonary:     Effort: Pulmonary effort is normal.     Breath sounds: Normal breath sounds.  Abdominal:     General: Bowel sounds are decreased.     Palpations: Abdomen is soft.     Tenderness: There is generalized abdominal tenderness.  Musculoskeletal:     Cervical back: Neck supple.     Right lower leg: No edema.     Left lower leg: No edema.  Skin:    General: Skin is warm and dry.     Findings: No erythema or rash.  Neurological:     Mental Status: He is alert and oriented to person, place,  and time.  Psychiatric:        Behavior: Behavior normal.     ED Results / Procedures / Treatments   Labs (all labs ordered are listed, but only abnormal results are displayed) Labs Reviewed  CBC WITH DIFFERENTIAL/PLATELET - Abnormal; Notable for the following components:      Result Value   RBC 4.20 (*)    All other components within normal limits  COMPREHENSIVE METABOLIC PANEL - Abnormal; Notable for the following components:   BUN 22 (*)    Calcium 8.6 (*)    All other components within normal limits  LIPASE, BLOOD  URINALYSIS, ROUTINE W REFLEX MICROSCOPIC    EKG None  Radiology CT Abdomen Pelvis W Contrast  Result Date:  12/15/2020 CLINICAL DATA:  Nonlocalized acute abdominal pain. Diagnosed with COVID on Tuesday. No bowel movement for 4 days. EXAM: CT ABDOMEN AND PELVIS WITH CONTRAST TECHNIQUE: Multidetector CT imaging of the abdomen and pelvis was performed using the standard protocol following bolus administration of intravenous contrast. CONTRAST:  63mL OMNIPAQUE IOHEXOL 300 MG/ML  SOLN COMPARISON:  None. FINDINGS: Lower chest: No acute abnormality. Hepatobiliary: No focal liver abnormality. No gallstones, gallbladder wall thickening, or pericholecystic fluid. No biliary dilatation. Pancreas: No focal lesion. Normal pancreatic contour. No surrounding inflammatory changes. No main pancreatic ductal dilatation. Spleen: Normal in size without focal abnormality. Adrenals/Urinary Tract: No adrenal nodule bilaterally. Bilateral kidneys enhance symmetrically. No hydronephrosis. No hydroureter. The urinary bladder is unremarkable. Stomach/Bowel: Stomach is within normal limits. No evidence of bowel wall thickening or dilatation. Stool throughout the colon with increased stool burden within the rectosigmoid colon. Appendix appears normal. Vascular/Lymphatic: No abdominal aorta or iliac aneurysm. No abdominal, pelvic, or inguinal lymphadenopathy. Reproductive: Prostate is unremarkable.  Other: No intraperitoneal free fluid. No intraperitoneal free gas. No organized fluid collection. Musculoskeletal: No acute or significant osseous findings. IMPRESSION: Stool throughout the colon with otherwise no acute intra-abdominal or intrapelvic abnormality. Electronically Signed   By: Tish Frederickson M.D.   On: 12/15/2020 15:14    Procedures Procedures   Medications Ordered in ED Medications  iohexol (OMNIPAQUE) 9 MG/ML oral solution 500 mL (1,000 mLs Oral Contrast Given 12/15/20 1232)  iohexol (OMNIPAQUE) 300 MG/ML solution 100 mL (80 mLs Intravenous Contrast Given 12/15/20 1443)    ED Course  I have reviewed the triage vital signs and the nursing notes.  Pertinent labs & imaging results that were available during my care of the patient were reviewed by me and considered in my medical decision making (see chart for details).  Clinical Course as of 12/15/20 1716  Fri Dec 15, 2020  [id number] 30 year old male with history of cerebral palsy, recently COVID-positive presents with abdominal pain and constipation. Patient is well-appearing on exam, has generalized abdominal tenderness with diminished bowel sounds. Labs were checked and reassuring including CBC and CMP, lipase within normal notes.  CT abdomen pelvis is negative for small bowel obstruction, does show significant constipation. [LM]  1716 Patient was given a soapsuds enema, and has had output of some stool and at this point would like to be discharged home to continue process at home.  Patient plans to try coffee as well as MiraLAX.  Given return to ER precautions and advised to follow-up with PCP. [LM]    Clinical Course User Index [LM] Alden Hipp   MDM Rules/Calculators/A&P                          Final Clinical Impression(s) / ED Diagnoses Final diagnoses:  Constipation, unspecified constipation type    Rx / DC Orders ED Discharge Orders    None       Jeannie Fend, PA-C 12/15/20 1716    Milagros Loll, MD 12/16/20 [phone]

## 2020-12-15 NOTE — ED Notes (Signed)
Soap suds performed at this time

## 2020-12-15 NOTE — ED Notes (Addendum)
In and out cath for 600 ml  Urine clear yellow patient tolerated well

## 2020-12-15 NOTE — ED Notes (Signed)
Patient transported to CT 

## 2020-12-15 NOTE — Discharge Instructions (Addendum)
Try coffee to induce bowel movement as this has helped him in the past.  If this does not work, try MiraLAX.  Start with 1 dose, continue with MiraLAX, you may take up to 1 container of MiraLAX.  Return to the emergency room for fever, worsening pain or other concerns.

## 2020-12-16 DIAGNOSIS — Z743 Need for continuous supervision: Secondary | ICD-10-CM | POA: Diagnosis not present

## 2020-12-16 DIAGNOSIS — K5900 Constipation, unspecified: Secondary | ICD-10-CM | POA: Diagnosis not present

## 2020-12-16 DIAGNOSIS — R279 Unspecified lack of coordination: Secondary | ICD-10-CM | POA: Diagnosis not present

## 2020-12-21 DIAGNOSIS — Z79899 Other long term (current) drug therapy: Secondary | ICD-10-CM | POA: Diagnosis not present

## 2020-12-21 DIAGNOSIS — F3341 Major depressive disorder, recurrent, in partial remission: Secondary | ICD-10-CM | POA: Diagnosis not present

## 2020-12-21 DIAGNOSIS — F410 Panic disorder [episodic paroxysmal anxiety] without agoraphobia: Secondary | ICD-10-CM | POA: Diagnosis not present

## 2020-12-21 DIAGNOSIS — F411 Generalized anxiety disorder: Secondary | ICD-10-CM | POA: Diagnosis not present

## 2020-12-24 DIAGNOSIS — R059 Cough, unspecified: Secondary | ICD-10-CM | POA: Diagnosis not present

## 2020-12-24 DIAGNOSIS — U071 COVID-19: Secondary | ICD-10-CM | POA: Diagnosis not present

## 2020-12-25 DIAGNOSIS — R059 Cough, unspecified: Secondary | ICD-10-CM | POA: Diagnosis not present

## 2020-12-25 DIAGNOSIS — U071 COVID-19: Secondary | ICD-10-CM | POA: Diagnosis not present

## 2020-12-28 DIAGNOSIS — U071 COVID-19: Secondary | ICD-10-CM | POA: Diagnosis not present

## 2020-12-28 DIAGNOSIS — R059 Cough, unspecified: Secondary | ICD-10-CM | POA: Diagnosis not present

## 2020-12-28 DIAGNOSIS — G809 Cerebral palsy, unspecified: Secondary | ICD-10-CM | POA: Diagnosis not present

## 2021-01-24 DIAGNOSIS — F3341 Major depressive disorder, recurrent, in partial remission: Secondary | ICD-10-CM | POA: Diagnosis not present

## 2021-01-24 DIAGNOSIS — F411 Generalized anxiety disorder: Secondary | ICD-10-CM | POA: Diagnosis not present

## 2021-01-24 DIAGNOSIS — Z79899 Other long term (current) drug therapy: Secondary | ICD-10-CM | POA: Diagnosis not present

## 2021-01-24 DIAGNOSIS — F410 Panic disorder [episodic paroxysmal anxiety] without agoraphobia: Secondary | ICD-10-CM | POA: Diagnosis not present

## 2021-02-02 DIAGNOSIS — G809 Cerebral palsy, unspecified: Secondary | ICD-10-CM | POA: Diagnosis not present

## 2021-02-23 ENCOUNTER — Emergency Department (HOSPITAL_COMMUNITY): Payer: BC Managed Care – PPO

## 2021-02-23 ENCOUNTER — Encounter (HOSPITAL_COMMUNITY): Payer: Self-pay | Admitting: *Deleted

## 2021-02-23 ENCOUNTER — Emergency Department (HOSPITAL_COMMUNITY)
Admission: EM | Admit: 2021-02-23 | Discharge: 2021-02-23 | Disposition: A | Discharge status: Home / Self Care | Payer: BC Managed Care – PPO | Attending: Emergency Medicine | Admitting: Emergency Medicine

## 2021-02-23 ENCOUNTER — Other Ambulatory Visit: Payer: Self-pay

## 2021-02-23 DIAGNOSIS — R102 Pelvic and perineal pain: Secondary | ICD-10-CM | POA: Diagnosis not present

## 2021-02-23 DIAGNOSIS — S299XXA Unspecified injury of thorax, initial encounter: Secondary | ICD-10-CM | POA: Diagnosis not present

## 2021-02-23 DIAGNOSIS — S50312A Abrasion of left elbow, initial encounter: Secondary | ICD-10-CM | POA: Insufficient documentation

## 2021-02-23 DIAGNOSIS — S80211A Abrasion, right knee, initial encounter: Secondary | ICD-10-CM | POA: Diagnosis not present

## 2021-02-23 DIAGNOSIS — R42 Dizziness and giddiness: Secondary | ICD-10-CM | POA: Diagnosis not present

## 2021-02-23 DIAGNOSIS — R58 Hemorrhage, not elsewhere classified: Secondary | ICD-10-CM | POA: Diagnosis not present

## 2021-02-23 DIAGNOSIS — R531 Weakness: Secondary | ICD-10-CM | POA: Diagnosis not present

## 2021-02-23 DIAGNOSIS — S50811A Abrasion of right forearm, initial encounter: Secondary | ICD-10-CM | POA: Insufficient documentation

## 2021-02-23 DIAGNOSIS — S59902A Unspecified injury of left elbow, initial encounter: Secondary | ICD-10-CM | POA: Diagnosis not present

## 2021-02-23 DIAGNOSIS — Y92410 Unspecified street and highway as the place of occurrence of the external cause: Secondary | ICD-10-CM | POA: Diagnosis not present

## 2021-02-23 DIAGNOSIS — S80212A Abrasion, left knee, initial encounter: Secondary | ICD-10-CM | POA: Diagnosis not present

## 2021-02-23 DIAGNOSIS — R0902 Hypoxemia: Secondary | ICD-10-CM | POA: Diagnosis not present

## 2021-02-23 HISTORY — DX: Anxiety disorder, unspecified: F41.9

## 2021-02-23 HISTORY — DX: Cerebral palsy, unspecified: G80.9

## 2021-02-23 HISTORY — DX: Depression, unspecified: F32.A

## 2021-02-23 MED ORDER — CLONAZEPAM 0.5 MG PO TABS
0.5000 mg | ORAL_TABLET | Freq: Once | ORAL | Status: DC
  Filled 2021-02-23: qty 1
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
## 2021-02-26 ENCOUNTER — Encounter (HOSPITAL_COMMUNITY): Payer: Self-pay

## 2021-02-26 DIAGNOSIS — S060X0D Concussion without loss of consciousness, subsequent encounter: Secondary | ICD-10-CM | POA: Diagnosis not present

## 2021-02-28 DIAGNOSIS — F410 Panic disorder [episodic paroxysmal anxiety] without agoraphobia: Secondary | ICD-10-CM | POA: Diagnosis not present

## 2021-02-28 DIAGNOSIS — F3341 Major depressive disorder, recurrent, in partial remission: Secondary | ICD-10-CM | POA: Diagnosis not present

## 2021-02-28 DIAGNOSIS — F411 Generalized anxiety disorder: Secondary | ICD-10-CM | POA: Diagnosis not present

## 2021-02-28 DIAGNOSIS — Z79899 Other long term (current) drug therapy: Secondary | ICD-10-CM | POA: Diagnosis not present

## 2021-03-06 ENCOUNTER — Other Ambulatory Visit: Payer: Self-pay | Admitting: *Deleted

## 2021-03-06 ENCOUNTER — Encounter: Payer: Self-pay | Admitting: *Deleted

## 2021-03-08 ENCOUNTER — Other Ambulatory Visit: Payer: Self-pay | Admitting: Family Medicine

## 2021-03-08 ENCOUNTER — Other Ambulatory Visit: Payer: Self-pay

## 2021-03-08 ENCOUNTER — Encounter: Payer: Self-pay | Admitting: Neurology

## 2021-03-08 ENCOUNTER — Ambulatory Visit
Admission: RE | Admit: 2021-03-08 | Discharge: 2021-03-08 | Disposition: A | Discharge status: Home / Self Care | Payer: BC Managed Care – PPO | Source: Ambulatory Visit | Attending: Family Medicine | Admitting: Family Medicine

## 2021-03-08 ENCOUNTER — Ambulatory Visit (INDEPENDENT_AMBULATORY_CARE_PROVIDER_SITE_OTHER): Payer: BC Managed Care – PPO | Admitting: Neurology

## 2021-03-08 VITALS — BP 141/77 | HR 95 | Ht 67.0 in | Wt 119.0 lb

## 2021-03-08 DIAGNOSIS — M79645 Pain in left finger(s): Secondary | ICD-10-CM

## 2021-03-08 DIAGNOSIS — S060X0S Concussion without loss of consciousness, sequela: Secondary | ICD-10-CM

## 2021-03-08 DIAGNOSIS — S62235A Other nondisplaced fracture of base of first metacarpal bone, left hand, initial encounter for closed fracture: Secondary | ICD-10-CM | POA: Diagnosis not present

## 2021-03-08 DIAGNOSIS — R519 Headache, unspecified: Secondary | ICD-10-CM

## 2021-03-08 MED ORDER — GABAPENTIN 100 MG PO CAPS: 100.0000 mg | ORAL_CAPSULE | Freq: Three times a day (TID) | ORAL | 3 refills | Status: DC
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
## 2021-03-08 NOTE — Patient Instructions (Signed)
We will start gabapentin for the headache.  Neurontin (gabapentin) may result in drowsiness, ankle swelling, gait instability, or possibly dizziness. Please contact our office if significant side effects occur with this medication.  

## 2021-03-14 DIAGNOSIS — S62245A Nondisplaced fracture of shaft of first metacarpal bone, left hand, initial encounter for closed fracture: Secondary | ICD-10-CM | POA: Diagnosis not present

## 2021-03-19 DIAGNOSIS — M25511 Pain in right shoulder: Secondary | ICD-10-CM | POA: Diagnosis not present

## 2021-03-19 DIAGNOSIS — F418 Other specified anxiety disorders: Secondary | ICD-10-CM | POA: Diagnosis not present

## 2021-03-20 ENCOUNTER — Ambulatory Visit
Admission: RE | Admit: 2021-03-20 | Discharge: 2021-03-20 | Disposition: A | Discharge status: Home / Self Care | Payer: BC Managed Care – PPO | Source: Ambulatory Visit | Attending: Home Modifications | Admitting: Home Modifications

## 2021-03-20 ENCOUNTER — Other Ambulatory Visit: Payer: Self-pay | Admitting: Home Modifications

## 2021-03-20 DIAGNOSIS — G8929 Other chronic pain: Secondary | ICD-10-CM

## 2021-03-20 DIAGNOSIS — M25511 Pain in right shoulder: Secondary | ICD-10-CM | POA: Diagnosis not present

## 2021-03-23 ENCOUNTER — Other Ambulatory Visit: Payer: Self-pay | Admitting: Physician Assistant

## 2021-03-23 DIAGNOSIS — F331 Major depressive disorder, recurrent, moderate: Secondary | ICD-10-CM

## 2021-03-23 NOTE — Telephone Encounter (Signed)
Left message for pt to schedule

## 2021-03-23 NOTE — Telephone Encounter (Signed)
Please schedule appt

## 2021-03-26 ENCOUNTER — Other Ambulatory Visit: Payer: Self-pay | Admitting: Sports Medicine

## 2021-03-26 ENCOUNTER — Ambulatory Visit
Admission: RE | Admit: 2021-03-26 | Discharge: 2021-03-26 | Disposition: A | Discharge status: Home / Self Care | Payer: Medicaid Other | Source: Ambulatory Visit | Attending: Sports Medicine | Admitting: Sports Medicine

## 2021-03-26 ENCOUNTER — Other Ambulatory Visit: Payer: Self-pay

## 2021-03-26 DIAGNOSIS — M79645 Pain in left finger(s): Secondary | ICD-10-CM

## 2021-03-26 DIAGNOSIS — S62235A Other nondisplaced fracture of base of first metacarpal bone, left hand, initial encounter for closed fracture: Secondary | ICD-10-CM | POA: Diagnosis not present

## 2021-03-29 DIAGNOSIS — S62235D Other nondisplaced fracture of base of first metacarpal bone, left hand, subsequent encounter for fracture with routine healing: Secondary | ICD-10-CM | POA: Diagnosis not present

## 2021-03-29 NOTE — Telephone Encounter (Signed)
FYI, for whoever speaks with him. He has very slow speech but is very intelligent. Just be aware of this when you talk to him to get him scheduled. He has done telephone visits in the past.

## 2021-04-29 DIAGNOSIS — Z03818 Encounter for observation for suspected exposure to other biological agents ruled out: Secondary | ICD-10-CM | POA: Diagnosis not present

## 2021-04-29 DIAGNOSIS — R509 Fever, unspecified: Secondary | ICD-10-CM | POA: Diagnosis not present

## 2021-04-29 DIAGNOSIS — J029 Acute pharyngitis, unspecified: Secondary | ICD-10-CM | POA: Diagnosis not present

## 2021-04-30 ENCOUNTER — Ambulatory Visit
Admission: RE | Admit: 2021-04-30 | Discharge: 2021-04-30 | Disposition: A | Discharge status: Home / Self Care | Payer: BC Managed Care – PPO | Source: Ambulatory Visit | Attending: Sports Medicine | Admitting: Sports Medicine

## 2021-04-30 ENCOUNTER — Other Ambulatory Visit: Payer: Self-pay | Admitting: Sports Medicine

## 2021-04-30 DIAGNOSIS — M79645 Pain in left finger(s): Secondary | ICD-10-CM

## 2021-05-03 DIAGNOSIS — S62619D Displaced fracture of proximal phalanx of unspecified finger, subsequent encounter for fracture with routine healing: Secondary | ICD-10-CM | POA: Diagnosis not present

## 2021-05-07 ENCOUNTER — Ambulatory Visit
Admission: RE | Admit: 2021-05-07 | Discharge: 2021-05-07 | Disposition: A | Discharge status: Home / Self Care | Payer: BC Managed Care – PPO | Source: Ambulatory Visit | Attending: Neurology | Admitting: Neurology

## 2021-05-07 ENCOUNTER — Other Ambulatory Visit: Payer: Self-pay

## 2021-05-07 DIAGNOSIS — S0990XA Unspecified injury of head, initial encounter: Secondary | ICD-10-CM | POA: Diagnosis not present

## 2021-05-07 DIAGNOSIS — S060X0S Concussion without loss of consciousness, sequela: Secondary | ICD-10-CM

## 2021-05-07 DIAGNOSIS — G809 Cerebral palsy, unspecified: Secondary | ICD-10-CM | POA: Diagnosis not present

## 2021-05-07 DIAGNOSIS — R569 Unspecified convulsions: Secondary | ICD-10-CM | POA: Diagnosis not present

## 2021-05-08 ENCOUNTER — Telehealth: Payer: Self-pay | Admitting: Neurology

## 2021-05-08 NOTE — Telephone Encounter (Signed)
I called the pt and advised of results. He verbalized understanding and appreciation for the call.

## 2021-05-08 NOTE — Telephone Encounter (Signed)
I tried to call the patient, unable to reach him at either number, unable to leave a message.  We will contact him later today, let him know that CT of the head is unremarkable.  It is likely that he has a concussion, but no structural brain injury is seen.   CT head 05/07/21:  IMPRESSION: This CT scan of the head without contrast shows the following: 1.  No acute findings. 2.  Deep brain stimulator electrodes terminating in the globus pallidi bilaterally. 3.   No changes compared to the CT scan from 01/04/2010.

## 2021-05-24 ENCOUNTER — Telehealth: Payer: Self-pay | Admitting: Neurology

## 2021-05-24 MED ORDER — RIZATRIPTAN BENZOATE 10 MG PO TABS: 10.0000 mg | ORAL_TABLET | Freq: Three times a day (TID) | ORAL | 3 refills | Status: DC | PRN

## 2021-05-24 NOTE — Telephone Encounter (Signed)
Pt called stating that he woke up today having sensitivity to light and he would like to discuss with RN or Provider.

## 2021-05-24 NOTE — Telephone Encounter (Signed)
I called patient.  He has had some mild headache, dizziness, light sensitivity.  This likely is a migraine, could be from the minor head trauma that occurred earlier with trauma triggered migraine.  I will give him some Maxalt to take if needed.
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
## 2021-06-06 DIAGNOSIS — J029 Acute pharyngitis, unspecified: Secondary | ICD-10-CM | POA: Diagnosis not present

## 2021-06-06 DIAGNOSIS — R509 Fever, unspecified: Secondary | ICD-10-CM | POA: Diagnosis not present

## 2021-06-15 DIAGNOSIS — Z23 Encounter for immunization: Secondary | ICD-10-CM | POA: Diagnosis not present

## 2021-06-15 DIAGNOSIS — Z0000 Encounter for general adult medical examination without abnormal findings: Secondary | ICD-10-CM | POA: Diagnosis not present

## 2021-06-15 DIAGNOSIS — F0781 Postconcussional syndrome: Secondary | ICD-10-CM | POA: Diagnosis not present

## 2021-06-20 ENCOUNTER — Other Ambulatory Visit: Payer: Self-pay | Admitting: Physician Assistant

## 2021-06-20 DIAGNOSIS — F331 Major depressive disorder, recurrent, moderate: Secondary | ICD-10-CM

## 2021-06-21 NOTE — Telephone Encounter (Signed)
Sarah, please don't try him again, it looks like he's seeing another provider elsewhere. Thanks.

## 2021-06-21 NOTE — Telephone Encounter (Signed)
Unable to leave a message- no voicemail set up
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
## 2021-06-28 NOTE — Patient Instructions (Signed)
Below is our plan:  We will start propranolol 10mg  daily 30 minutes before bedtime for 1 week. If you tolerate medication well, please increase dose to 10mg  twice daily (10mg  in the morning and 10mg  in the evening)   Please make sure you are staying well hydrated. I recommend 50-60 ounces daily. Well balanced diet and regular exercise encouraged. Consistent sleep schedule with 6-8 hours recommended.   Please continue follow up with care team as directed.   Follow up with Dr in 3-4 months   You may receive a survey regarding today's visit. I encourage you to leave honest feed back as I do use this information to improve patient care. Thank you for seeing me today!

## 2021-07-02 ENCOUNTER — Encounter: Payer: Self-pay | Admitting: Family Medicine

## 2021-07-02 ENCOUNTER — Ambulatory Visit (INDEPENDENT_AMBULATORY_CARE_PROVIDER_SITE_OTHER): Payer: BC Managed Care – PPO | Admitting: Family Medicine

## 2021-07-02 VITALS — BP 127/69 | HR 78 | Ht 67.0 in | Wt 120.0 lb

## 2021-07-02 DIAGNOSIS — G44329 Chronic post-traumatic headache, not intractable: Secondary | ICD-10-CM

## 2021-07-02 DIAGNOSIS — G803 Athetoid cerebral palsy: Secondary | ICD-10-CM | POA: Diagnosis not present

## 2021-07-02 DIAGNOSIS — R269 Unspecified abnormalities of gait and mobility: Secondary | ICD-10-CM | POA: Diagnosis not present

## 2021-07-02 DIAGNOSIS — S060X0S Concussion without loss of consciousness, sequela: Secondary | ICD-10-CM

## 2021-07-02 MED ORDER — PROPRANOLOL HCL 10 MG PO TABS: 10.0000 mg | ORAL_TABLET | Freq: Two times a day (BID) | ORAL | 1 refills | Status: DC

## 2021-07-04 DIAGNOSIS — F3341 Major depressive disorder, recurrent, in partial remission: Secondary | ICD-10-CM | POA: Diagnosis not present

## 2021-07-04 DIAGNOSIS — Z79899 Other long term (current) drug therapy: Secondary | ICD-10-CM | POA: Diagnosis not present

## 2021-07-04 DIAGNOSIS — F411 Generalized anxiety disorder: Secondary | ICD-10-CM | POA: Diagnosis not present

## 2021-07-04 DIAGNOSIS — F410 Panic disorder [episodic paroxysmal anxiety] without agoraphobia: Secondary | ICD-10-CM | POA: Diagnosis not present

## 2021-07-10 DIAGNOSIS — F411 Generalized anxiety disorder: Secondary | ICD-10-CM | POA: Diagnosis not present

## 2021-07-20 DIAGNOSIS — Z4542 Encounter for adjustment and management of neuropacemaker (brain) (peripheral nerve) (spinal cord): Secondary | ICD-10-CM | POA: Diagnosis not present

## 2021-07-20 DIAGNOSIS — G249 Dystonia, unspecified: Secondary | ICD-10-CM | POA: Diagnosis not present

## 2021-07-20 DIAGNOSIS — G803 Athetoid cerebral palsy: Secondary | ICD-10-CM | POA: Diagnosis not present

## 2021-07-23 ENCOUNTER — Telehealth: Payer: Self-pay | Admitting: Family Medicine

## 2021-07-23 MED ORDER — PROPRANOLOL HCL 20 MG PO TABS: 20.0000 mg | ORAL_TABLET | Freq: Two times a day (BID) | ORAL | 1 refills | Status: DC

## 2021-07-23 MED ORDER — SUMATRIPTAN SUCCINATE 50 MG PO TABS: 50.0000 mg | ORAL_TABLET | ORAL | 2 refills | Status: DC | PRN

## 2021-07-23 NOTE — Telephone Encounter (Signed)
Called the patient and advised that Amy recommends to Increase propranolol to 20mg  BID and she called in sumatriptan called in for acute management. Reviewed the directions for both for the pt. Pt verbalized understanding.

## 2021-07-23 NOTE — Telephone Encounter (Signed)
Called the patient back. Over the weekend he woke up and was having severe dizziness which somewhat resolved but developed a horrible headache and has had that ongoing for 3 days. He states it is a pounding headache and worsens throughout the day. He has taking the rizatriptan but hates the way that it makes him feel. He is currently on the propranolol 10 mg bid which he is taking every day. Other than the rizatriptan advil otc is only other med he has tried. Advised I would talk with Amy and get her thoughts and reach back out.

## 2021-07-23 NOTE — Telephone Encounter (Signed)
Pt called states none of his medications are helping. He describes the pain as if he's banging his head on the wall. Pt requesting a call back.

## 2021-07-30 DIAGNOSIS — F411 Generalized anxiety disorder: Secondary | ICD-10-CM | POA: Diagnosis not present

## 2021-08-09 DIAGNOSIS — B349 Viral infection, unspecified: Secondary | ICD-10-CM | POA: Diagnosis not present

## 2021-08-09 DIAGNOSIS — Z20822 Contact with and (suspected) exposure to covid-19: Secondary | ICD-10-CM | POA: Diagnosis not present

## 2021-08-22 DIAGNOSIS — U071 COVID-19: Secondary | ICD-10-CM | POA: Diagnosis not present

## 2021-08-22 DIAGNOSIS — J069 Acute upper respiratory infection, unspecified: Secondary | ICD-10-CM | POA: Diagnosis not present

## 2021-08-22 DIAGNOSIS — R6883 Chills (without fever): Secondary | ICD-10-CM | POA: Diagnosis not present

## 2021-09-07 DIAGNOSIS — G809 Cerebral palsy, unspecified: Secondary | ICD-10-CM | POA: Diagnosis not present

## 2021-09-14 DIAGNOSIS — Z9689 Presence of other specified functional implants: Secondary | ICD-10-CM | POA: Diagnosis not present

## 2021-09-14 DIAGNOSIS — G249 Dystonia, unspecified: Secondary | ICD-10-CM | POA: Diagnosis not present

## 2021-09-14 DIAGNOSIS — Z9682 Presence of neurostimulator: Secondary | ICD-10-CM | POA: Diagnosis not present

## 2021-09-14 DIAGNOSIS — G803 Athetoid cerebral palsy: Secondary | ICD-10-CM | POA: Diagnosis not present

## 2021-10-02 DIAGNOSIS — F411 Generalized anxiety disorder: Secondary | ICD-10-CM | POA: Diagnosis not present

## 2021-10-04 ENCOUNTER — Ambulatory Visit (INDEPENDENT_AMBULATORY_CARE_PROVIDER_SITE_OTHER): Payer: BC Managed Care – PPO | Admitting: Psychiatry

## 2021-10-04 ENCOUNTER — Encounter: Payer: Self-pay | Admitting: Psychiatry

## 2021-10-04 ENCOUNTER — Other Ambulatory Visit: Payer: Self-pay

## 2021-10-04 VITALS — BP 140/75 | HR 87 | Ht 67.0 in | Wt 117.0 lb

## 2021-10-04 DIAGNOSIS — G43009 Migraine without aura, not intractable, without status migrainosus: Secondary | ICD-10-CM

## 2021-10-04 DIAGNOSIS — R413 Other amnesia: Secondary | ICD-10-CM | POA: Diagnosis not present

## 2021-10-04 MED ORDER — GABAPENTIN 100 MG PO CAPS: 100.0000 mg | ORAL_CAPSULE | Freq: Three times a day (TID) | ORAL | 11 refills | Status: DC

## 2021-10-04 MED ORDER — PROPRANOLOL HCL ER 60 MG PO CP24: 60.0000 mg | ORAL_CAPSULE | Freq: Every day | ORAL | 11 refills | Status: DC

## 2021-10-04 MED ORDER — SUMATRIPTAN SUCCINATE 50 MG PO TABS: 50.0000 mg | ORAL_TABLET | ORAL | 11 refills | Status: DC | PRN
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
## 2021-10-04 NOTE — Patient Instructions (Addendum)
Increase propranolol to 60 mg daily. Continue gabapentin 100 mg three times a days Continue Imitrex as needed for migraines Referral to speech therapy for cognitive rehab

## 2021-10-15 ENCOUNTER — Ambulatory Visit: Payer: BC Managed Care – PPO | Attending: Psychiatry

## 2021-10-15 ENCOUNTER — Other Ambulatory Visit: Payer: Self-pay

## 2021-10-15 DIAGNOSIS — R41841 Cognitive communication deficit: Secondary | ICD-10-CM | POA: Diagnosis not present

## 2021-10-15 DIAGNOSIS — R413 Other amnesia: Secondary | ICD-10-CM | POA: Diagnosis not present

## 2021-10-15 DIAGNOSIS — S0990XA Unspecified injury of head, initial encounter: Secondary | ICD-10-CM | POA: Insufficient documentation
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
## 2021-10-15 NOTE — Patient Instructions (Signed)

## 2021-10-17 NOTE — Addendum Note (Signed)
Addended by: Verdie Mosher B on: 10/17/2021 03:36 PM   Modules accepted: Orders

## 2021-11-05 ENCOUNTER — Other Ambulatory Visit: Payer: Self-pay

## 2021-11-05 ENCOUNTER — Ambulatory Visit: Payer: BC Managed Care – PPO | Attending: Psychiatry

## 2021-11-05 DIAGNOSIS — R41841 Cognitive communication deficit: Secondary | ICD-10-CM | POA: Insufficient documentation
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
## 2021-11-05 NOTE — Patient Instructions (Signed)
? ?  AM - [place], Prop, Tiz, Klon ? ?Noon - [place], Sherren Mocha ? ?Bed - [place], Rem, [place], Claudean Kinds ?

## 2021-11-12 ENCOUNTER — Ambulatory Visit: Payer: BC Managed Care – PPO

## 2021-11-13 DIAGNOSIS — F411 Generalized anxiety disorder: Secondary | ICD-10-CM | POA: Diagnosis not present

## 2021-11-14 DIAGNOSIS — R110 Nausea: Secondary | ICD-10-CM | POA: Diagnosis not present

## 2021-11-17 DIAGNOSIS — F419 Anxiety disorder, unspecified: Secondary | ICD-10-CM | POA: Diagnosis not present

## 2021-11-17 DIAGNOSIS — K529 Noninfective gastroenteritis and colitis, unspecified: Secondary | ICD-10-CM | POA: Diagnosis not present

## 2021-11-17 DIAGNOSIS — F331 Major depressive disorder, recurrent, moderate: Secondary | ICD-10-CM | POA: Diagnosis not present

## 2021-11-19 ENCOUNTER — Ambulatory Visit: Payer: BC Managed Care – PPO | Attending: Psychiatry

## 2021-11-19 DIAGNOSIS — R41841 Cognitive communication deficit: Secondary | ICD-10-CM | POA: Insufficient documentation
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
## 2021-11-19 NOTE — Patient Instructions (Signed)
?  Talk to your therapist about these feelings of frustration and anger when the alarm sounds to remind you to take your medicine. ?

## 2021-12-03 ENCOUNTER — Ambulatory Visit: Payer: BC Managed Care – PPO

## 2021-12-03 DIAGNOSIS — R41841 Cognitive communication deficit: Secondary | ICD-10-CM | POA: Diagnosis not present
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
## 2021-12-05 DIAGNOSIS — F410 Panic disorder [episodic paroxysmal anxiety] without agoraphobia: Secondary | ICD-10-CM | POA: Diagnosis not present

## 2021-12-05 DIAGNOSIS — Z79899 Other long term (current) drug therapy: Secondary | ICD-10-CM | POA: Diagnosis not present

## 2021-12-05 DIAGNOSIS — F411 Generalized anxiety disorder: Secondary | ICD-10-CM | POA: Diagnosis not present

## 2021-12-05 DIAGNOSIS — F331 Major depressive disorder, recurrent, moderate: Secondary | ICD-10-CM | POA: Diagnosis not present

## 2021-12-10 ENCOUNTER — Ambulatory Visit: Payer: BC Managed Care – PPO

## 2021-12-10 DIAGNOSIS — R41841 Cognitive communication deficit: Secondary | ICD-10-CM | POA: Diagnosis not present
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
## 2021-12-19 ENCOUNTER — Telehealth: Payer: Self-pay | Admitting: Psychiatry

## 2021-12-19 MED ORDER — PROPRANOLOL HCL ER 60 MG PO CP24: 60.0000 mg | ORAL_CAPSULE | Freq: Every day | ORAL | 0 refills | Status: DC

## 2021-12-19 NOTE — Telephone Encounter (Signed)
Refill has been sent to requested store. I attempted to reach the pt to update on this but pt was unavailable and vm was full.  ?If pt calls back TE staff can relay that rx has been sent.  ?

## 2021-12-19 NOTE — Addendum Note (Signed)
Addended by: Ann Maki on: 12/19/2021 01:32 PM ? ? Modules accepted: Orders ? ?

## 2021-12-19 NOTE — Telephone Encounter (Signed)
FYI pt called back, message was relayed to him ?

## 2021-12-19 NOTE — Telephone Encounter (Signed)
Pt is in [place] and failed to pack his propranolol ER (INDERAL LA) 60 MG 24 hr capsule, he is asking if it can be called into [phone].  Pt will be in [place] until Saturday afternoon, he has not had the medication today. ?

## 2022-02-11 ENCOUNTER — Other Ambulatory Visit: Payer: Self-pay | Admitting: Psychiatry

## 2022-03-27 ENCOUNTER — Telehealth: Payer: Self-pay | Admitting: Family Medicine

## 2022-03-27 NOTE — Telephone Encounter (Signed)
LVM and sent mychart msg informing pt of need to reschedule 8/17 appointment - Amy out

## 2022-04-03 DIAGNOSIS — F411 Generalized anxiety disorder: Secondary | ICD-10-CM | POA: Diagnosis not present

## 2022-04-03 DIAGNOSIS — Z79899 Other long term (current) drug therapy: Secondary | ICD-10-CM | POA: Diagnosis not present

## 2022-04-03 DIAGNOSIS — F410 Panic disorder [episodic paroxysmal anxiety] without agoraphobia: Secondary | ICD-10-CM | POA: Diagnosis not present

## 2022-04-03 DIAGNOSIS — F331 Major depressive disorder, recurrent, moderate: Secondary | ICD-10-CM | POA: Diagnosis not present

## 2022-04-04 ENCOUNTER — Ambulatory Visit: Payer: BC Managed Care – PPO | Admitting: Family Medicine

## 2022-04-18 NOTE — Patient Instructions (Signed)
Below is our plan:  We will continue propranolol 60mg  daily and gabapentin 100mg  three times daily. Use sumatriptan as needed.   Please make sure you are staying well hydrated. I recommend 50-60 ounces daily. Well balanced diet and regular exercise encouraged. Consistent sleep schedule with 6-8 hours recommended.   Please continue follow up with care team as directed.   Follow up with me in 1 year   You may receive a survey regarding today's visit. I encourage you to leave honest feed back as I do use this information to improve patient care. Thank you for seeing me today!    Management of Memory Problems   There are some general things you can do to help manage your memory problems.  Your memory may not in fact recover, but by using techniques and strategies you will be able to manage your memory difficulties better.   1)  Establish a routine. Try to establish and then stick to a regular routine.  By doing this, you will get used to what to expect and you will reduce the need to rely on your memory.  Also, try to do things at the same time of day, such as taking your medication or checking your calendar first thing in the morning. Think about think that you can do as a part of a regular routine and make a list.  Then enter them into a daily planner to remind you.  This will help you establish a routine.   2)  Organize your environment. Organize your environment so that it is uncluttered.  Decrease visual stimulation.  Place everyday items such as keys or cell phone in the same place every day (ie.  Basket next to front door) Use post it notes with a brief message to yourself (ie. Turn off light, lock the door) Use labels to indicate where things go (ie. Which cupboards are for food, dishes, etc.) Keep a notepad and pen by the telephone to take messages   3)  Memory Aids A diary or journal/notebook/daily planner Making a list (shopping list, chore list, to do list that needs to be  done) Using an alarm as a reminder (kitchen timer or cell phone alarm) Using cell phone to store information (Notes, Calendar, Reminders) Calendar/White board placed in a prominent position Post-it notes   In order for memory aids to be useful, you need to have good habits.  It's no good remembering to make a note in your journal if you don't remember to look in it.  Try setting aside a certain time of day to look in journal.   4)  Improving mood and managing fatigue. There may be other factors that contribute to memory difficulties.  Factors, such as anxiety, depression and tiredness can affect memory. Regular gentle exercise can help improve your mood and give you more energy. Simple relaxation techniques may help relieve symptoms of anxiety Try to get back to completing activities or hobbies you enjoyed doing in the past. Learn to pace yourself through activities to decrease fatigue. Find out about some local support groups where you can share experiences with others. Try and achieve 7-8 hours of sleep at night.

## 2022-04-18 NOTE — Progress Notes (Signed)
Chief Complaint  Patient presents with   Follow-up    RM 2, alone. Ambulates with walker. F/u  for migraines.     HISTORY OF PRESENT ILLNESS:  04/23/22 ALL:  Eddie Valentine is a 31 y.o. male here today for follow up for migraines. He was last seen by Dr Delena Bali 09/2021. Propranolol was increased to 60mg  daily and gabapentin 100mg  TID continued. Sumatriptan 50mg  PRN used for abortive therapy. Since, he reports headaches are improved. He may have 4 headache days a month. He has not had to use sumatriptan in months.   He was referred to speech therapy for cognitive rehab following a concussion 02/2021. He completed therapy. He has been using reminders on his phone to help with medication administration and checking mailbox. He is followed by psychiatry. He takes Trintellix 20mg  daily and clonazepam 0.5mg  2-3 times daily. He works full time in for . He lives alone. He does not drive.    HISTORY (copied from Dr 03/2021 previous note)  Brief HPI: 31 year old male with a history of depression, GAD, cerebral palsy s/p deep brain stimulator who follows in clinic for headaches following a concussion in July 2022. CTH showed no acute process.   At his last visit he was started on propranolol for headache prevention and Imitrex for rescue.   Interval History: Since his last visit he has had a decrease in headache frequency. He is down to one migraine per week. Imitrex resolves his migraine when he takes it for breakthrough migraines.   Feels his short term memory is still not back to baseline since before his concussion.   Headache days per month: 4 Headache free days per month: 26   Current Headache Regimen: Preventative: propranolol 20 mg BID, gabapentin 100 mg TID Abortive: Imitrex 50 mg as needed   Prior Therapies                                  Gabapentin 100 mg TID Propranolol 20 mg BID Maxalt 10 mg PRN - drowsiness, cognitive slowing Imitrex 50 mg PRN Zanaflex 2  mg TID   REVIEW OF SYSTEMS: Out of a complete 14 system review of symptoms, the patient complains only of the following symptoms, and all other reviewed systems are negative.   ALLERGIES: Allergies  Allergen Reactions   Oxybutynin Other (See Comments)    hallucinations       HOME MEDICATIONS: Outpatient Medications Prior to Visit  Medication Sig Dispense Refill   Ascorbic Acid (VITAMIN C) 100 MG tablet Take 100 mg by mouth daily.     cholecalciferol (VITAMIN D3) 25 MCG (1000 UNIT) tablet Take 1,000 Units by mouth daily.     clonazePAM (KLONOPIN) 0.5 MG tablet TAKE 1 TABLET BY MOUTH 4 TIMES A DAY AS NEEDED FOR ANXIETY (Patient taking differently: Take 0.5 mg by mouth 3 (three) times daily as needed for anxiety.) 120 tablet 0   gabapentin (NEURONTIN) 100 MG capsule Take 1 capsule (100 mg total) by mouth 3 (three) times daily. 90 capsule 11   Multiple Vitamin (MULTIVITAMIN WITH MINERALS) TABS tablet Take 1 tablet by mouth daily.     naproxen sodium (ALEVE) 220 MG tablet Take 220 mg by mouth 2 (two) times daily as needed (pain).     propranolol ER (INDERAL LA) 60 MG 24 hr capsule TAKE 1 CAPSULE BY MOUTH EVERY DAY 90 capsule 3   SUMAtriptan (IMITREX)  50 MG tablet Take 1 tablet (50 mg total) by mouth every 2 (two) hours as needed for migraine. May repeat in 2 hours if headache persists or recurs. 10 tablet 11   tiZANidine (ZANAFLEX) 2 MG tablet Take 2 mg by mouth 3 (three) times daily.     TRINTELLIX 20 MG TABS tablet TAKE 1 TABLET BY MOUTH EVERY DAY (Patient taking differently: Take 20 mg by mouth daily.) 90 tablet 1   mirtazapine (REMERON) 7.5 MG tablet Take 7.5 mg by mouth at bedtime.     No facility-administered medications prior to visit.     PAST MEDICAL HISTORY: Past Medical History:  Diagnosis Date   Anxiety    Anxiety and depression    Cerebral palsy ([facility])    Depression    GAD (generalized anxiety disorder)    Post concussion syndrome    Seizure ([facility])    isolated 2001      PAST SURGICAL HISTORY: Past Surgical History:  Procedure Laterality Date   DEEP BRAIN STIMULATOR PLACEMENT  2009     FAMILY HISTORY: Family History  Problem Relation Age of Onset   Rheum arthritis Father    Other Father        Father recently died of recurrent MRSA infections apparently     SOCIAL HISTORY: Social History   Socioeconomic History   Marital status: Single    Spouse name: Not on file   Number of children: 0   Years of education: Not on file   Highest education level: Bachelor's degree (e.g., BA, AB, BS)  Occupational History   Occupation: [employer]    Comment: marketing/degree in Audiological scientist  Tobacco Use   Smoking status: Former    Types: Cigarettes   Smokeless tobacco: Never  Vaping Use   Vaping Use: Never used  Substance and Sexual Activity   Alcohol use: Yes    Comment: 1-2 per week   Drug use: Not Currently    Types: Marijuana    Comment: rare   Sexual activity: Not on file  Other Topics Concern   Not on file  Social History Narrative   Decaf coffee   Social Determinants of Health   Financial Resource Strain: Not on file  Food Insecurity: Not on file  Transportation Needs: Not on file  Physical Activity: Inactive (06/18/2018)   Exercise Vital Sign    Days of Exercise per Week: 0 days    Minutes of Exercise per Session: 0 min  Stress: Stress Concern Present (06/18/2018)   [employer] of Occupational Health - Occupational Stress Questionnaire    Feeling of Stress : To some extent  Social Connections: Somewhat Isolated (06/18/2018)   Social Connection and Isolation Panel [NHANES]    Frequency of Communication with Friends and Family: More than three times a week    Frequency of Social Gatherings with Friends and Family: Three times a week    Attends Religious Services: 1 to 4 times per year    Active Member of Clubs or Organizations: No    Attends Banker Meetings: Never    Marital Status: Never married   Intimate Partner Violence: Not on file     PHYSICAL EXAM  Vitals:   04/23/22 1401  BP: 132/89  Pulse: (!) 104  Weight: 120 lb 8 oz (54.7 kg)  Height: 5\' 7"  (1.702 m)   Body mass index is 18.87 kg/m.  Generalized: Well developed, in no acute distress  Cardiology: normal rate and rhythm, no murmur auscultated  Respiratory:  clear to auscultation bilaterally    Neurological examination  Mentation: Alert oriented to time, place, history taking. Follows all commands, speech is dysarthic with spastic voice Cranial nerve II-XII: Pupils were equal round reactive to light. Extraocular movements were full, visual field were full on confrontational test. Facial sensation and strength were normal. Head turning and shoulder shrug  were normal and symmetric. Motor: The motor testing reveals 5 over 5 strength of all 4 extremities.Spasticity of all ext. Increased tone in upper and lower ext.  Gait and station: Gait wide and spastic, uses Rolator, Unable to tandem   DIAGNOSTIC DATA (LABS, IMAGING, TESTING) - I reviewed patient records, labs, notes, testing and imaging myself where available.  Lab Results  Component Value Date   WBC 8.1 12/15/2020   HGB 13.7 12/15/2020   HCT 39.6 12/15/2020   MCV 94.3 12/15/2020   PLT 208 12/15/2020      Component Value Date/Time   NA 140 12/15/2020 1201   K 4.0 12/15/2020 1201   CL 107 12/15/2020 1201   CO2 27 12/15/2020 1201   GLUCOSE 83 12/15/2020 1201   BUN 22 (H) 12/15/2020 1201   CREATININE 0.89 12/15/2020 1201   CALCIUM 8.6 (L) 12/15/2020 1201   PROT 7.0 12/15/2020 1201   ALBUMIN 3.8 12/15/2020 1201   AST 28 12/15/2020 1201   ALT 21 12/15/2020 1201   ALKPHOS 42 12/15/2020 1201   BILITOT 0.8 12/15/2020 1201   GFRNONAA >60 12/15/2020 1201   GFRAA >60 03/31/2018 0421   No results found for: "CHOL", "HDL", "LDLCALC", "LDLDIRECT", "TRIG", "CHOLHDL" No results found for: "HGBA1C" No results found for: "VITAMINB12" No results found for:  "TSH"      No data to display               No data to display           ASSESSMENT AND PLAN  31 y.o. year old male  has a past medical history of Anxiety, Anxiety and depression, Cerebral palsy ([facility]), Depression, GAD (generalized anxiety disorder), Post concussion syndrome, and Seizure ([facility]). here with    Migraine without aura and without status migrainosus, not intractable  Concussion without loss of consciousness, sequela ([facility])  Chronic post-traumatic headache, not intractable  Eddie Valentine reports that headaches are significantly improved. We will continue propranolol 60mg  daily and gabapentin 100mg  three times daily. Continue sumatriptan as needed. Memory compensation strategies advised. Healthy lifestyle habits encouraged. He will follow up with PCP as directed. He will return to see me in 1 year, sooner if needed. He verbalizes understanding and agreement with this plan.   No orders of the defined types were placed in this encounter.    No orders of the defined types were placed in this encounter.    , MSN, FNP-C 04/23/2022, 2:10 PM  [facility] [address] [place], [address] [place] [phone]

## 2022-04-23 ENCOUNTER — Encounter: Payer: Self-pay | Admitting: Family Medicine

## 2022-04-23 ENCOUNTER — Ambulatory Visit (INDEPENDENT_AMBULATORY_CARE_PROVIDER_SITE_OTHER): Payer: BC Managed Care – PPO | Admitting: Family Medicine

## 2022-04-23 VITALS — BP 132/89 | HR 104 | Ht 67.0 in | Wt 120.5 lb

## 2022-04-23 DIAGNOSIS — G44329 Chronic post-traumatic headache, not intractable: Secondary | ICD-10-CM | POA: Diagnosis not present

## 2022-04-23 DIAGNOSIS — G43009 Migraine without aura, not intractable, without status migrainosus: Secondary | ICD-10-CM

## 2022-04-23 DIAGNOSIS — S060X0S Concussion without loss of consciousness, sequela: Secondary | ICD-10-CM

## 2022-06-12 DIAGNOSIS — Z79899 Other long term (current) drug therapy: Secondary | ICD-10-CM | POA: Diagnosis not present

## 2022-06-12 DIAGNOSIS — F411 Generalized anxiety disorder: Secondary | ICD-10-CM | POA: Diagnosis not present

## 2022-06-12 DIAGNOSIS — F331 Major depressive disorder, recurrent, moderate: Secondary | ICD-10-CM | POA: Diagnosis not present

## 2022-06-12 DIAGNOSIS — F410 Panic disorder [episodic paroxysmal anxiety] without agoraphobia: Secondary | ICD-10-CM | POA: Diagnosis not present

## 2022-06-17 DIAGNOSIS — K5900 Constipation, unspecified: Secondary | ICD-10-CM | POA: Diagnosis not present

## 2022-06-17 DIAGNOSIS — M62838 Other muscle spasm: Secondary | ICD-10-CM | POA: Diagnosis not present

## 2022-06-17 DIAGNOSIS — Z0000 Encounter for general adult medical examination without abnormal findings: Secondary | ICD-10-CM | POA: Diagnosis not present

## 2022-06-17 DIAGNOSIS — F418 Other specified anxiety disorders: Secondary | ICD-10-CM | POA: Diagnosis not present

## 2022-06-17 DIAGNOSIS — G809 Cerebral palsy, unspecified: Secondary | ICD-10-CM | POA: Diagnosis not present

## 2022-07-15 DIAGNOSIS — R051 Acute cough: Secondary | ICD-10-CM | POA: Diagnosis not present

## 2022-07-15 DIAGNOSIS — J0100 Acute maxillary sinusitis, unspecified: Secondary | ICD-10-CM | POA: Diagnosis not present

## 2022-07-31 DIAGNOSIS — F3341 Major depressive disorder, recurrent, in partial remission: Secondary | ICD-10-CM | POA: Diagnosis not present

## 2022-07-31 DIAGNOSIS — Z79899 Other long term (current) drug therapy: Secondary | ICD-10-CM | POA: Diagnosis not present

## 2022-07-31 DIAGNOSIS — F411 Generalized anxiety disorder: Secondary | ICD-10-CM | POA: Diagnosis not present

## 2022-07-31 DIAGNOSIS — F410 Panic disorder [episodic paroxysmal anxiety] without agoraphobia: Secondary | ICD-10-CM | POA: Diagnosis not present

## 2022-09-13 ENCOUNTER — Other Ambulatory Visit: Payer: Self-pay | Admitting: Psychiatry

## 2022-09-16 ENCOUNTER — Encounter: Payer: Self-pay | Admitting: *Deleted

## 2022-09-16 NOTE — Telephone Encounter (Signed)
Appointment scheduled with Amy on 04/24/23

## 2022-09-24 ENCOUNTER — Encounter: Payer: Self-pay | Admitting: Genetic Counselor

## 2022-09-24 ENCOUNTER — Other Ambulatory Visit: Payer: Self-pay

## 2022-09-24 ENCOUNTER — Inpatient Hospital Stay: Payer: Managed Care, Other (non HMO) | Attending: Nurse Practitioner | Admitting: Genetic Counselor

## 2022-09-24 ENCOUNTER — Inpatient Hospital Stay: Payer: Managed Care, Other (non HMO)

## 2022-09-24 DIAGNOSIS — Z8481 Family history of carrier of genetic disease: Secondary | ICD-10-CM | POA: Diagnosis not present

## 2022-09-24 LAB — GENETIC SCREENING ORDER

## 2022-09-24 NOTE — Progress Notes (Signed)
REFERRING PROVIDER: Marda Stalker, PA-C [place],  [address] [address]  PRIMARY PROVIDER:  Marda Stalker, PA-C  PRIMARY REASON FOR VISIT:  1. Family history of gene mutation    HISTORY OF PRESENT ILLNESS:   Eddie Valentine, a 32 y.o. male, was seen for a [place] cancer genetics consultation at the request of Dr. Rolland Porter due to a family history of an APC I1307K mutation.  Eddie Valentine presents to clinic today to discuss the possibility of a hereditary predisposition to cancer, to discuss genetic testing, and to further clarify his future cancer risks, as well as potential cancer risks for family members.   Eddie Valentine is a 32 y.o. male with no personal history of cancer.     Past Medical History:  Diagnosis Date   Anxiety    Anxiety and depression    Cerebral palsy ([place])    Depression    GAD (generalized anxiety disorder)    Post concussion syndrome    Seizure ([place])    isolated 2001    Past Surgical History:  Procedure Laterality Date   DEEP BRAIN STIMULATOR PLACEMENT  2009    Social History   Socioeconomic History   Marital status: Single    Spouse name: Not on file   Number of children: 0   Years of education: Not on file   Highest education level: Bachelor's degree (e.g., BA, AB, BS)  Occupational History   Occupation: [employer]    Comment: marketing/degree in Press photographer  Tobacco Use   Smoking status: Former    Types: Cigarettes   Smokeless tobacco: Never  Vaping Use   Vaping Use: Never used  Substance and Sexual Activity   Alcohol use: Yes    Comment: 1-2 per week   Drug use: Not Currently    Types: Marijuana    Comment: rare   Sexual activity: Not on file  Other Topics Concern   Not on file  Social History Narrative   Decaf coffee   Social Determinants of Health   Financial Resource Strain: Not on file  Food Insecurity: Not on file  Transportation Needs: Not on file  Physical Activity: Inactive (06/18/2018)   Exercise Vital  Sign    Days of Exercise per Week: 0 days    Minutes of Exercise per Session: 0 min  Stress: Stress Concern Present (06/18/2018)   [place]    Feeling of Stress : To some extent  Social Connections: Somewhat Isolated (06/18/2018)   Social Connection and Isolation Panel [NHANES]    Frequency of Communication with Friends and Family: More than three times a week    Frequency of Social Gatherings with Friends and Family: Three times a week    Attends Religious Services: 1 to 4 times per year    Active Member of Clubs or Organizations: No    Attends Archivist Meetings: Never    Marital Status: Never married     FAMILY HISTORY:  We obtained a detailed, 4-generation family history.  Significant diagnoses are listed below: Family History  Problem Relation Age of Onset   Other Mother        APC increased risk allele p.I1307K   Rheum arthritis Father    Other Father        Father recently died of recurrent MRSA infections apparently   Other Maternal Grandmother        APC increased risk allele p.I1307K   Pancreatic cancer Maternal Grandmother  50   Prostate cancer Paternal Grandfather 86       metastatic     Eddie Valentine maternal grandmother was diagnosed with pancreatic cancer at age 3. Therefore, she had [facility] Multi-Cancer Panel (84 genes) and was found to have the APC increased risk allele, I1307K. His mother had testing as well and was also found to carry the APC increased risk allele, I1307K. Eddie Valentine paternal grandfather was diagnosed with prostate cancer at age 44, he died due to metastatic prostate cancer at age 85. His paternal grandmother was diagnosed with lung cancer at age 63, she smoked and died at age 62. There is reported Ashkenazi Jewish ancestry.  GENETIC COUNSELING ASSESSMENT: Eddie Valentine is a 32 y.o. male with a family history of an APC I1307K gene mutation. We, therefore, discussed and  recommended the following at today's visit.   DISCUSSION:  We discussed that 5 - 10% of cancer is hereditary and there are many genes associated with hereditary cancer syndromes.  We discussed that testing is beneficial for several reasons, including knowing about other cancer risks, identifying potential screening and risk-reduction options that may be appropriate, and to understanding if other family members could be at risk for cancer and allowing them to undergo genetic testing.  We reviewed his mother's genetic testing results. She was found to have an APC gene increased risk allele, specifically the I1307K increased risk allele. The APC I1307K variant is associated with a 5-10% risk for colon cancer. Individuals with this variant are recommended to begin colonoscopies at age 57 and repeat every 5 years. More frequent colonoscopies may be recommended if polyps are identified.   Hereditary predisposition to cancer due to pathogenic variants in the APC gene has autosomal dominant inheritance. This means that an individual with a pathogenic variant has a 50% chance of passing the condition on to his/her offspring. Identification of a pathogenic variant allows for the recognition of at-risk relatives who can pursue testing for the familial variant.  Eddie Valentine also has a paternal family history of cancer. Therefore, we reviewed the option to proceed with single site testing vs. a gene panel. We discussed the implications of a negative, positive, carrier and/or variant of uncertain significant result. Eddie Valentine elected to proceed with [facility] Common Cancer Panel.   The Common Hereditary Cancers Panel offered by [facility] includes sequencing and/or deletion duplication testing of the following 48 genes: APC, ATM, AXIN2, BAP1, BARD1, BMPR1A, BRCA1, BRCA2, BRIP1, CDH1, CDK4, CDKN2A (p14ARF and p16INK4a only), CHEK2, CTNNA1, DICER1, EPCAM (Deletion/duplication testing only), FH, GREM1 (promoter region  duplication testing only), HOXB13, KIT, MBD4, MEN1, MLH1, MSH2, MSH3, MSH6, MUTYH, NF1, NHTL1, PALB2, PDGFRA, PMS2, POLD1, POLE, PTEN, RAD51C, RAD51D, SDHA (sequencing analysis only except exon 14), SDHB, SDHC, SDHD, SMAD4, SMARCA4. STK11, TP53, TSC1, TSC2, and VHL.  Based on Eddie Valentine's family history of cancer, he meets medical criteria for genetic testing. Despite that he meets criteria, he may still have an out of pocket cost. We discussed that if his out of pocket cost for testing is over $100, the laboratory will call and confirm whether he wants to proceed with testing.  If the out of pocket cost of testing is less than $100 he will be billed by the genetic testing laboratory.   We discussed that some people do not want to undergo genetic testing due to fear of genetic discrimination.  A federal law called the Genetic Information Non-Discrimination Act (GINA) of 2008 helps protect individuals against genetic discrimination based on  their genetic test results.  It impacts both health insurance and employment.  With health insurance, it protects against increased premiums, being kicked off insurance or being forced to take a test in order to be insured.  For employment it protects against hiring, firing and promoting decisions based on genetic test results.  GINA does not apply to those in the [employer], those who work for companies with less than 15 employees, and new life insurance or long-term disability insurance policies.  Health status due to a cancer diagnosis is not protected under GINA.  PLAN: After considering the risks, benefits, and limitations, Eddie Valentine provided informed consent to pursue genetic testing and the blood sample was sent to [facility] for analysis of the Common Cancer Panel. Results should be available within approximately 2-3 weeks' time, at which point they will be disclosed by telephone to Eddie Valentine, as will any additional recommendations warranted by these  results. Eddie Valentine will receive a summary of his genetic counseling visit and a copy of his results once available. This information will also be available in Epic.   Eddie Valentine questions were answered to his satisfaction today. Our contact information was provided should additional questions or concerns arise. Thank you for the referral and allowing [place] to share in the care of your patient.   Lucille Passy, MS, [facility] Genetic Counselor [email or web address] (P) [phone]  The patient was seen for a total of 40 minutes in face-to-face genetic counseling.  The patient brought his mother. Drs. Lindi Adie and/or Burr Medico were available to discuss this case as needed.  _______________________________________________________________________ For Office Staff:  Number of people involved in session: 2 Was an Intern/ student involved with case: no

## 2022-10-03 ENCOUNTER — Encounter: Payer: Self-pay | Admitting: Genetic Counselor

## 2022-10-03 ENCOUNTER — Telehealth: Payer: Self-pay | Admitting: Genetic Counselor

## 2022-10-03 DIAGNOSIS — Z1379 Encounter for other screening for genetic and chromosomal anomalies: Secondary | ICD-10-CM | POA: Insufficient documentation

## 2022-10-03 NOTE — Telephone Encounter (Signed)
I attempted to contact Mr. Delprado to discuss his genetic testing results (48 genes). I left a voicemail requesting he call me back at [phone].  Lucille Passy, MS, [facility] Genetic Counselor [email or web address] (P) [phone]

## 2022-10-08 ENCOUNTER — Telehealth: Payer: Self-pay | Admitting: Genetic Counselor

## 2022-10-08 NOTE — Telephone Encounter (Signed)
I contacted Eddie Valentine to discuss his genetic testing results. He does have the APC I1307K increased risk allele identified in his mother. No pathogenic variants were identified in the remaining 47 genes analyzed. Detailed clinic note to follow.  The test report has been scanned into EPIC and is located under the Molecular Pathology section of the Results Review tab.  A portion of the result report is included below for reference.   Lucille Passy, MS, [facility] Genetic Counselor [email or web address] (P) [phone]

## 2022-10-15 ENCOUNTER — Other Ambulatory Visit: Payer: Self-pay | Admitting: Psychiatry

## 2022-10-17 ENCOUNTER — Ambulatory Visit: Payer: Self-pay | Admitting: Genetic Counselor

## 2022-10-17 ENCOUNTER — Encounter: Payer: Self-pay | Admitting: Genetic Counselor

## 2022-10-17 DIAGNOSIS — Z1379 Encounter for other screening for genetic and chromosomal anomalies: Secondary | ICD-10-CM

## 2022-10-17 NOTE — Progress Notes (Signed)
HPI:   Eddie Valentine was previously seen in the [place] clinic due to a family history of a gene mutation and concerns regarding a hereditary predisposition to cancer. Please refer to our prior cancer genetics clinic note for more information regarding our discussion, assessment and recommendations, at the time. Mr. Allegretto recent genetic test results were disclosed to him, as were recommendations warranted by these results. These results and recommendations are discussed in more detail below.  CANCER HISTORY:  Oncology History   No history exists.    FAMILY HISTORY:  We obtained a detailed, 4-generation family history.  Significant diagnoses are listed below:      Family History  Problem Relation Age of Onset   Other Mother          APC increased risk allele p.I1307K   Rheum arthritis Father     Other Father          Father recently died of recurrent MRSA infections apparently   Other Maternal Grandmother          APC increased risk allele p.I1307K   Pancreatic cancer Maternal Grandmother 105   Prostate cancer Paternal Grandfather 23        metastatic       Mr. Lamora's maternal grandmother was diagnosed with pancreatic cancer at age 63. Therefore, she had [facility] Multi-Cancer Panel (84 genes) and was found to have the APC increased risk allele, I1307K. His mother had testing as well and was also found to carry the APC increased risk allele, I1307K. Mr. Bellamy's paternal grandfather was diagnosed with prostate cancer at age 38, he died due to metastatic prostate cancer at age 26. His paternal grandmother was diagnosed with lung cancer at age 60, she smoked and died at age 35. There is reported Ashkenazi Jewish ancestry.  GENETIC TEST RESULTS:  Mr. Martinz tested positive for a single pathogenic variant (harmful genetic change) in the APC gene. Specifically, this variant is p.I1307K. . Of note, this result DOES NOT indicate a diagnosis of familial adenomatous polyposis  syndrome (FAP).  The remaining 47 genes were negative.  The test report has been scanned into EPIC and is located under the Molecular Pathology section of the Results Review tab.  A portion of the result report is included below for reference. Genetic testing reported out on 10/03/2022.      Cancer Risks for the p.I1307K variant in the APC gene: Colon cancer, 5-10% risk  This information is based on current understanding of the gene and may change in the future.  Management Recommendations: Colonoscopy screening every 5 years beginning at age 81 If an individual has a first-degree relative with colorectal cancer, screening should begin 10 years prior to the relative's age at diagnosis or at age 26, whichever comes first.  Implications for Family Members: Hereditary predisposition to cancer due to pathogenic variants in the APC gene has autosomal dominant inheritance. This means that an individual with a pathogenic variant has a 50% chance of passing the condition on to his/her offspring. Identification of a pathogenic variant allows for the recognition of at-risk relatives who can pursue testing for the familial variant.  Family members are encouraged to consider genetic testing for this familial pathogenic variant. As there are generally no childhood cancer risks associated with pathogenic variants in the APC gene p. I1307K pathogenic variant, individuals in the family are not recommended to have testing until they reach at least 34 years of age. They may contact our office at [phone]  for more information or to schedule an appointment.  Complimentary testing for the familial variant is available for 150 days from the report date.  Family members who live outside of the area are encouraged to find a genetic counselor in their area by visiting: [email or web address].  Resources: FORCE (Facing Our Risk of Cancer Empowered) is a resource for those with a hereditary  predisposition to develop cancer.  FORCE provides information about risk reduction, advocacy, legislation, and clinical trials.  Additionally, FORCE provides a platform for collaboration and support; which includes: peer navigation, message boards, local support groups, a toll-free helpline, research registry and recruitment, advocate training, published medical research, webinars, brochures, mastectomy photos, and more.  For more information, visit www.facingourrisk.org  Our contact number was provided. Mr. Mccowan questions were answered to his satisfaction, and he knows he is welcome to call us at anytime with additional questions or concerns.   Lucille Passy, MS, [facility] Genetic Counselor [email or web address] (P) [phone]

## 2023-01-15 ENCOUNTER — Telehealth: Payer: Self-pay | Admitting: Family Medicine

## 2023-01-15 NOTE — Telephone Encounter (Signed)
Dr Doxey(pt's psychiatrist) is asking for a call from Amy, NP to relay information about pt, he is asking for a call on his cell#[phone]. He said it is not urgent

## 2023-01-16 NOTE — Telephone Encounter (Signed)
I spoke to Dr Thayer Ohm 01/15/2023. He reports gabapentin has been helpful in managing Eddie Valentine's anxiety and wishes to increase dose. He will manage gabapentin dose for now.

## 2023-04-16 ENCOUNTER — Other Ambulatory Visit: Payer: Self-pay

## 2023-04-16 MED ORDER — PROPRANOLOL HCL ER 60 MG PO CP24: 60.0000 mg | ORAL_CAPSULE | Freq: Every day | ORAL | 0 refills | Status: DC

## 2023-04-23 NOTE — Progress Notes (Deleted)
No chief complaint on file.   HISTORY OF PRESENT ILLNESS:  04/23/23 ALL:  Eddie Valentine returns for follow up for migraines.   04/24/2023 ALL: Eddie Valentine is a 32 y.o. male here today for follow up for migraines. He was last seen by Dr Delena Bali 09/2021. Propranolol was increased to 60mg  daily and gabapentin 100mg  TID continued. Sumatriptan 50mg  PRN used for abortive therapy. Since, he reports headaches are improved. He may have 4 headache days a month. He has not had to use sumatriptan in months.   He was referred to speech therapy for cognitive rehab following a concussion 02/2021. He completed therapy. He has been using reminders on his phone to help with medication administration and checking mailbox. He is followed by psychiatry. He takes Trintellix 20mg  daily and clonazepam 0.5mg  2-3 times daily. He works full time in Civil Service fast streamer for [employer]. He lives alone. He does not drive.    HISTORY (copied from Dr Quentin Mulling previous note)  Brief HPI: 32 year old male with a history of depression, GAD, cerebral palsy s/p deep brain stimulator who follows in clinic for headaches following a concussion in July 2022. CTH showed no acute process.   At his last visit he was started on propranolol for headache prevention and Imitrex for rescue.   Interval History: Since his last visit he has had a decrease in headache frequency. He is down to one migraine per week. Imitrex resolves his migraine when he takes it for breakthrough migraines.   Feels his short term memory is still not back to baseline since before his concussion.   Headache days per month: 4 Headache free days per month: 26   Current Headache Regimen: Preventative: propranolol 20 mg BID, gabapentin 100 mg TID Abortive: Imitrex 50 mg as needed   Prior Therapies                                  Gabapentin 100 mg TID Propranolol 20 mg BID Maxalt 10 mg PRN - drowsiness, cognitive slowing Imitrex 50 mg PRN Zanaflex 2 mg TID   REVIEW OF  SYSTEMS: Out of a complete 14 system review of symptoms, the patient complains only of the following symptoms, and all other reviewed systems are negative.   ALLERGIES: Allergies  Allergen Reactions   Oxybutynin Other (See Comments)    hallucinations       HOME MEDICATIONS: Outpatient Medications Prior to Visit  Medication Sig Dispense Refill   Ascorbic Acid (VITAMIN C) 100 MG tablet Take 100 mg by mouth daily.     cholecalciferol (VITAMIN D3) 25 MCG (1000 UNIT) tablet Take 1,000 Units by mouth daily.     clonazePAM (KLONOPIN) 0.5 MG tablet TAKE 1 TABLET BY MOUTH 4 TIMES A DAY AS NEEDED FOR ANXIETY (Patient taking differently: Take 0.5 mg by mouth 3 (three) times daily as needed for anxiety.) 120 tablet 0   gabapentin (NEURONTIN) 100 MG capsule TAKE 1 CAPSULE (100 MG TOTAL) BY MOUTH THREE TIMES DAILY. 90 capsule 7   Multiple Vitamin (MULTIVITAMIN WITH MINERALS) TABS tablet Take 1 tablet by mouth daily.     naproxen sodium (ALEVE) 220 MG tablet Take 220 mg by mouth 2 (two) times daily as needed (pain).     propranolol ER (INDERAL LA) 60 MG 24 hr capsule Take 1 capsule (60 mg total) by mouth daily. 90 capsule 0   SUMAtriptan (IMITREX) 50 MG tablet Take 1 tablet (50  mg total) by mouth every 2 (two) hours as needed for migraine. May repeat in 2 hours if headache persists or recurs. 10 tablet 11   tiZANidine (ZANAFLEX) 2 MG tablet Take 2 mg by mouth 3 (three) times daily.     TRINTELLIX 20 MG TABS tablet TAKE 1 TABLET BY MOUTH EVERY DAY (Patient taking differently: Take 20 mg by mouth daily.) 90 tablet 1   No facility-administered medications prior to visit.     PAST MEDICAL HISTORY: Past Medical History:  Diagnosis Date   Anxiety    Anxiety and depression    Cerebral palsy (H[facility])    Depression    GAD (generalized anxiety disorder)    Post concussion syndrome    Seizure ([facility])    isolated 2001     PAST SURGICAL HISTORY: Past Surgical History:  Procedure Laterality Date   DEEP  BRAIN STIMULATOR PLACEMENT  2009     FAMILY HISTORY: Family History  Problem Relation Age of Onset   Other Mother        APC increased risk allele p.I1307K   Rheum arthritis Father    Other Father        Father recently died of recurrent MRSA infections apparently   Other Maternal Grandmother        APC increased risk allele p.I1307K   Pancreatic cancer Maternal Grandmother 45   Lung cancer Paternal Grandmother 69       smoked   Prostate cancer Paternal Grandfather 29       metastatic     SOCIAL HISTORY: Social History   Socioeconomic History   Marital status: Single    Spouse name: Not on file   Number of children: 0   Years of education: Not on file   Highest education level: Bachelor's degree (e.g., BA, AB, BS)  Occupational History   Occupation: [employer]    Comment: marketing/degree in Audiological scientist  Tobacco Use   Smoking status: Former    Types: Cigarettes   Smokeless tobacco: Never  Vaping Use   Vaping status: Never Used  Substance and Sexual Activity   Alcohol use: Yes    Comment: 1-2 per week   Drug use: Not Currently    Types: Marijuana    Comment: rare   Sexual activity: Not on file  Other Topics Concern   Not on file  Social History Narrative   Decaf coffee   Social Determinants of Health   Financial Resource Strain: Not on file  Food Insecurity: Not on file  Transportation Needs: Not on file  Physical Activity: Inactive (06/18/2018)   Exercise Vital Sign    Days of Exercise per Week: 0 days    Minutes of Exercise per Session: 0 min  Stress: Stress Concern Present (06/18/2018)   [employer] of Occupational Health - Occupational Stress Questionnaire    Feeling of Stress : To some extent  Social Connections: Somewhat Isolated (06/18/2018)   Social Connection and Isolation Panel [NHANES]    Frequency of Communication with Friends and Family: More than three times a week    Frequency of Social Gatherings with Friends and Family: Three  times a week    Attends Religious Services: 1 to 4 times per year    Active Member of Clubs or Organizations: No    Attends Banker Meetings: Never    Marital Status: Never married  Intimate Partner Violence: Not on file     PHYSICAL EXAM  There were no vitals filed for this visit.  There is no height or weight on file to calculate BMI.  Generalized: Well developed, in no acute distress  Cardiology: normal rate and rhythm, no murmur auscultated  Respiratory: clear to auscultation bilaterally    Neurological examination  Mentation: Alert oriented to time, place, history taking. Follows all commands, speech is dysarthic with spastic voice Cranial nerve II-XII: Pupils were equal round reactive to light. Extraocular movements were full, visual field were full on confrontational test. Facial sensation and strength were normal. Head turning and shoulder shrug  were normal and symmetric. Motor: The motor testing reveals 5 over 5 strength of all 4 extremities.Spasticity of all ext. Increased tone in upper and lower ext.  Gait and station: Gait wide and spastic, uses Rolator, Unable to tandem   DIAGNOSTIC DATA (LABS, IMAGING, TESTING) - I reviewed patient records, labs, notes, testing and imaging myself where available.  Lab Results  Component Value Date   WBC 8.1 12/15/2020   HGB 13.7 12/15/2020   HCT 39.6 12/15/2020   MCV 94.3 12/15/2020   PLT 208 12/15/2020      Component Value Date/Time   NA 140 12/15/2020 1201   K 4.0 12/15/2020 1201   CL 107 12/15/2020 1201   CO2 27 12/15/2020 1201   GLUCOSE 83 12/15/2020 1201   BUN 22 (H) 12/15/2020 1201   CREATININE 0.89 12/15/2020 1201   CALCIUM 8.6 (L) 12/15/2020 1201   PROT 7.0 12/15/2020 1201   ALBUMIN 3.8 12/15/2020 1201   AST 28 12/15/2020 1201   ALT 21 12/15/2020 1201   ALKPHOS 42 12/15/2020 1201   BILITOT 0.8 12/15/2020 1201   GFRNONAA >60 12/15/2020 1201   GFRAA >60 03/31/2018 0421   No results found for:  "CHOL", "HDL", "LDLCALC", "LDLDIRECT", "TRIG", "CHOLHDL" No results found for: "HGBA1C" No results found for: "VITAMINB12" No results found for: "TSH"      No data to display               No data to display           ASSESSMENT AND PLAN  32 y.o. year old male  has a past medical history of Anxiety, Anxiety and depression, Cerebral palsy (HCC), Depression, GAD (generalized anxiety disorder), Post concussion syndrome, and Seizure ([facility]). here with    No diagnosis found.  Eddie Valentine reports that headaches are significantly improved. We will continue propranolol 60mg  daily and gabapentin 100mg  three times daily. Continue sumatriptan as needed. Memory compensation strategies advised. Healthy lifestyle habits encouraged. He will follow up with PCP as directed. He will return to see me in 1 year, sooner if needed. He verbalizes understanding and agreement with this plan.   No orders of the defined types were placed in this encounter.    No orders of the defined types were placed in this encounter.    Shawnie Dapper, MSN, FNP-C 04/23/2023, 4:33 PM  [facility] [address], [address] [place], [place] [address] [phone]

## 2023-04-24 ENCOUNTER — Ambulatory Visit: Payer: BC Managed Care – PPO | Admitting: Family Medicine

## 2023-04-24 ENCOUNTER — Telehealth: Payer: Self-pay | Admitting: Family Medicine

## 2023-04-24 NOTE — Telephone Encounter (Signed)
LVM and sent mychart msg informing pt of need to reschedule 04/24/23 appt - NP out

## 2023-05-05 ENCOUNTER — Other Ambulatory Visit: Payer: Self-pay

## 2023-05-05 MED ORDER — GABAPENTIN 100 MG PO CAPS: 100.0000 mg | ORAL_CAPSULE | Freq: Three times a day (TID) | ORAL | 0 refills | Status: DC

## 2023-05-13 ENCOUNTER — Ambulatory Visit (INDEPENDENT_AMBULATORY_CARE_PROVIDER_SITE_OTHER): Payer: Managed Care, Other (non HMO) | Admitting: Psychiatry

## 2023-05-13 ENCOUNTER — Encounter: Payer: Self-pay | Admitting: Psychiatry

## 2023-05-13 VITALS — BP 132/83 | HR 77 | Ht 67.0 in | Wt 116.2 lb

## 2023-05-13 DIAGNOSIS — G43009 Migraine without aura, not intractable, without status migrainosus: Secondary | ICD-10-CM | POA: Diagnosis not present

## 2023-05-13 DIAGNOSIS — G44319 Acute post-traumatic headache, not intractable: Secondary | ICD-10-CM

## 2023-05-13 MED ORDER — GABAPENTIN 100 MG PO CAPS: ORAL_CAPSULE | ORAL | 6 refills | Status: DC

## 2023-05-13 MED ORDER — PROPRANOLOL HCL ER 60 MG PO CP24: 60.0000 mg | ORAL_CAPSULE | Freq: Every day | ORAL | 3 refills | Status: DC

## 2023-05-13 MED ORDER — NURTEC 75 MG PO TBDP: 75.0000 mg | ORAL_TABLET | ORAL | 6 refills | Status: DC | PRN

## 2023-05-13 MED ORDER — SUMATRIPTAN SUCCINATE 50 MG PO TABS: 50.0000 mg | ORAL_TABLET | ORAL | 11 refills | Status: DC | PRN

## 2023-05-13 NOTE — Addendum Note (Signed)
Addended by: Ocie Doyne on: 05/13/2023 01:44 PM   Modules accepted: Orders

## 2023-05-13 NOTE — Progress Notes (Signed)
   CC:  headaches  Follow-up Visit  Last visit: 04/23/22  Brief HPI: 32 year old male with a history of depression, GAD, cerebral palsy s/p deep brain stimulator who follows in clinic for headaches following a concussion in July 2022. CTH at that time showed no acute process.   Interval History: Headaches are relatively well-controlled on propranolol and gabapentin. Gabapentin was increased to 100/100/400. He likes this dose but would prefer for Neurology to prescribe this instead of Psychiatry. He averages one migraine every 1-2 weeks. Imitrex continues to help, but makes him feel foggy.  He had a couple of unwitnessed falls this year due to his cerebral palsy. Last fall was in July. He is not sure if he hit his head but states he was "out for a while". Did not seek medical evaluation. Dizziness has been worse since his fall.  Migraine days per month: 4 Headache free days per month: 22  Current Headache Regimen: Preventative: propranolol 60 mg daily, gabapentin 100/100/400 Abortive: Imitrex 50 mg PRN   Prior Therapies                                  Gabapentin 100/100/400 Propranolol 20 mg BID Maxalt 10 mg PRN - drowsiness, cognitive slowing Imitrex 50 mg PRN - fogginess Zanaflex 2 mg TID  Physical Exam:   Vital Signs: BP 132/83   Pulse 77   Ht 5\' 7"  (1.702 m)   Wt 116 lb 3.2 oz (52.7 kg)   BMI 18.20 kg/m  GENERAL:  well appearing, in no acute distress, alert  SKIN:  Color, texture, turgor normal. No rashes or lesions HEAD:  Normocephalic/atraumatic. RESP: normal respiratory effort MSK:  No gross joint deformities.   NEUROLOGICAL: Mental Status: Alert, oriented to person, place and time, Follows commands Cranial Nerves: PERRL, face symmetric, mild dysarthria with spastic voice, hearing grossly intact Motor: increased tone bilateral upper and lower extremities Gait: wide-based spastic gait, walks with Rolator  IMPRESSION: 32 year old male with a history of  depression, GAD, cerebral palsy s/p deep brain stimulator who presents for follow up of migraines. His headaches are well-controlled with propranolol and gabapentin. Imitrex helps but makes him feel foggy. Will send in a prescription for Nurtec for rescue and see if he tolerates this better. He has had increased dizziness since a recent fall with suspected head trauma. Will order CTH to assess for posttraumatic changes.  PLAN: -[facility] -Prevention: continue propranolol 60 mg daily, gabapentin 100/100/400 -Rescue: Start Nurtec 75 mg PRN   Follow-up: 6 months  I spent a total of 27 minutes on the date of the service. Headache education was done. Discussed treatment options including preventive and acute medications. Discussed medication side effects, adverse reactions and drug interactions. Written educational materials and patient instructions outlining all of the above were given.  Ocie Doyne, MD 05/13/23 1:43 PM

## 2023-05-14 ENCOUNTER — Telehealth: Payer: Self-pay | Admitting: Psychiatry

## 2023-05-14 NOTE — Telephone Encounter (Signed)
sent to GI they obtain [employer] auth/trillium NPR [id number]

## 2023-06-03 ENCOUNTER — Other Ambulatory Visit (HOSPITAL_COMMUNITY): Payer: Self-pay

## 2023-06-06 ENCOUNTER — Ambulatory Visit
Admission: RE | Admit: 2023-06-06 | Discharge: 2023-06-06 | Disposition: A | Discharge status: Home / Self Care | Payer: Managed Care, Other (non HMO) | Source: Ambulatory Visit | Attending: Psychiatry | Admitting: Psychiatry

## 2023-06-06 DIAGNOSIS — G44319 Acute post-traumatic headache, not intractable: Secondary | ICD-10-CM

## 2023-06-10 ENCOUNTER — Telehealth: Payer: Self-pay | Admitting: *Deleted

## 2023-06-10 NOTE — Telephone Encounter (Signed)
-----   Message from Eddie Valentine sent at 06/09/2023  4:39 PM EDT ----- Please let him know that the CAT scan looked unchanged from the scan from 2022.  It shows the deep brain stimulator electrodes and was otherwise normal

## 2023-06-12 NOTE — Telephone Encounter (Signed)
Called pt at [phone]. Relayed results per Dr. Epimenio Foot note. He verbalized understanding.    Reminded him about appt 11/19/23 at 1:30pm with Amy Lomax,NP

## 2023-10-28 NOTE — Patient Instructions (Incomplete)

## 2023-10-28 NOTE — Progress Notes (Unsigned)
 New Patient Office Visit  Subjective    Patient ID: Eddie Valentine, male    DOB: [birth year]  Age: 33 y.o. MRN: [id number]  CC: No chief complaint on file.   HPI [name] presents to establish care ***  Outpatient Encounter Medications as of 10/31/2023  Medication Sig  . clonazePAM (KLONOPIN) 0.5 MG tablet TAKE 1 TABLET BY MOUTH 4 TIMES A DAY AS NEEDED FOR ANXIETY (Patient taking differently: Take 0.5 mg by mouth 3 (three) times daily as needed for anxiety.)  . gabapentin (NEURONTIN) 100 MG capsule Take 100 mg in the morning, 100 mg in the afternoon, and 400 mg at bedtime  . propranolol ER (INDERAL LA) 60 MG 24 hr capsule Take 1 capsule (60 mg total) by mouth daily.  . Rimegepant Sulfate (NURTEC) 75 MG TBDP Take 1 tablet (75 mg total) by mouth as needed.  . SUMAtriptan (IMITREX) 50 MG tablet Take 1 tablet (50 mg total) by mouth every 2 (two) hours as needed for migraine. May repeat in 2 hours if headache persists or recurs.  Marland Kitchen tiZANidine (ZANAFLEX) 2 MG tablet Take 2 mg by mouth 3 (three) times daily.   No facility-administered encounter medications on file as of 10/31/2023.    Past Medical History:  Diagnosis Date  . Anxiety   . Anxiety and depression   . Cerebral palsy (HCC)   . Depression   . GAD (generalized anxiety disorder)   . Post concussion syndrome   . Seizure (HCC)    isolated 2001    Past Surgical History:  Procedure Laterality Date  . DEEP BRAIN STIMULATOR PLACEMENT  2009    Family History  Problem Relation Age of Onset  . Other Mother        APC increased risk allele [id number]  . Rheum arthritis Father   . Other Father        Father recently died of recurrent MRSA infections apparently  . Other Maternal Grandmother        APC increased risk allele [id number]  . Pancreatic cancer Maternal Grandmother 12  . Lung cancer Paternal Grandmother 57       smoked  . Prostate cancer Paternal Grandfather 69       metastatic    Social History    Socioeconomic History  . Marital status: Single    Spouse name: Not on file  . Number of children: 0  . Years of education: Not on file  . Highest education level: Bachelor's degree (e.g., BA, AB, BS)  Occupational History  . Occupation: [employer]    Comment: marketing/degree in Audiological scientist  Tobacco Use  . Smoking status: Former    Types: Cigarettes  . Smokeless tobacco: Never  Vaping Use  . Vaping status: Never Used  Substance and Sexual Activity  . Alcohol use: Yes    Comment: 1-2 per week  . Drug use: Not Currently    Types: Marijuana    Comment: rare  . Sexual activity: Not on file  Other Topics Concern  . Not on file  Social History Narrative   Decaf coffee   Social Drivers of Corporate investment banker Strain: Not on file  Food Insecurity: Low Risk  (05/20/2023)   Received from [facility]   Hunger Vital Sign   . Worried About Programme researcher, broadcasting/film/video in the Last Year: Never true   . Ran Out of Food in the Last Year: Never true  Transportation Needs: No Transportation Needs (05/20/2023)   Received  from [facility]   . In the past 12 months, has lack of reliable transportation kept you from medical appointments, meetings, work or from getting things needed for daily living? : No  Physical Activity: Inactive (06/18/2018)   Exercise Vital Sign   . Days of Exercise per Week: 0 days   . Minutes of Exercise per Session: 0 min  Stress: Stress Concern Present (06/18/2018)   [employer] of Occupational Health - Occupational Stress Questionnaire   . Feeling of Stress : To some extent  Social Connections: Somewhat Isolated (06/18/2018)   Social Connection and Isolation Panel [NHANES]   . Frequency of Communication with Friends and Family: More than three times a week   . Frequency of Social Gatherings with Friends and Family: Three times a week   . Attends Religious Services: 1 to 4 times per year   . Active Member of Clubs or Organizations:  No   . Attends Banker Meetings: Never   . Marital Status: Never married  Intimate Partner Violence: Not on file    ROS Per HPI      Objective    There were no vitals taken for this visit.  Physical Exam Vitals and nursing note reviewed.  Constitutional:      Appearance: Normal appearance.  HENT:     Head: Normocephalic and atraumatic.  Eyes:     Extraocular Movements: Extraocular movements intact.  Cardiovascular:     Rate and Rhythm: Normal rate and regular rhythm.     Pulses: Normal pulses.     Heart sounds: Normal heart sounds.  Pulmonary:     Effort: Pulmonary effort is normal.     Breath sounds: Normal breath sounds.  Musculoskeletal:        General: Normal range of motion.     Cervical back: Normal range of motion.  Skin:    General: Skin is warm and dry.  Neurological:     General: No focal deficit present.     Mental Status: He is alert and oriented to person, place, and time.  Psychiatric:        Mood and Affect: Mood normal.        Behavior: Behavior normal.       Assessment & Plan:   There are no diagnoses linked to this encounter.   No follow-ups on file.   Moshe Cipro, FNP

## 2023-10-31 ENCOUNTER — Encounter: Payer: Self-pay | Admitting: Family Medicine

## 2023-10-31 ENCOUNTER — Ambulatory Visit (INDEPENDENT_AMBULATORY_CARE_PROVIDER_SITE_OTHER): Payer: Managed Care, Other (non HMO) | Admitting: Family Medicine

## 2023-10-31 VITALS — BP 110/70 | HR 88 | Temp 98.7°F | Ht 67.0 in | Wt 120.4 lb

## 2023-10-31 DIAGNOSIS — G803 Athetoid cerebral palsy: Secondary | ICD-10-CM | POA: Insufficient documentation

## 2023-10-31 DIAGNOSIS — G249 Dystonia, unspecified: Secondary | ICD-10-CM | POA: Insufficient documentation

## 2023-10-31 DIAGNOSIS — F330 Major depressive disorder, recurrent, mild: Secondary | ICD-10-CM

## 2023-10-31 DIAGNOSIS — F411 Generalized anxiety disorder: Secondary | ICD-10-CM

## 2023-10-31 DIAGNOSIS — Z9689 Presence of other specified functional implants: Secondary | ICD-10-CM | POA: Diagnosis not present

## 2023-10-31 NOTE — Assessment & Plan Note (Signed)
 Continue current medication regimen, follow up with neurology as scheduled.

## 2023-10-31 NOTE — Assessment & Plan Note (Signed)
 Continue current medication regimen, new referral to psych placed today.

## 2023-11-12 ENCOUNTER — Telehealth: Payer: Self-pay | Admitting: Psychiatry

## 2023-11-12 ENCOUNTER — Encounter: Payer: Self-pay | Admitting: Family Medicine

## 2023-11-12 ENCOUNTER — Ambulatory Visit: Admitting: Family Medicine

## 2023-11-12 VITALS — BP 119/74 | HR 82 | Ht 67.0 in | Wt 122.5 lb

## 2023-11-12 DIAGNOSIS — G44329 Chronic post-traumatic headache, not intractable: Secondary | ICD-10-CM | POA: Diagnosis not present

## 2023-11-12 DIAGNOSIS — R404 Transient alteration of awareness: Secondary | ICD-10-CM

## 2023-11-12 DIAGNOSIS — Z87898 Personal history of other specified conditions: Secondary | ICD-10-CM

## 2023-11-12 DIAGNOSIS — F419 Anxiety disorder, unspecified: Secondary | ICD-10-CM

## 2023-11-12 DIAGNOSIS — G43009 Migraine without aura, not intractable, without status migrainosus: Secondary | ICD-10-CM | POA: Diagnosis not present

## 2023-11-12 DIAGNOSIS — R55 Syncope and collapse: Secondary | ICD-10-CM | POA: Diagnosis not present

## 2023-11-12 DIAGNOSIS — F32A Depression, unspecified: Secondary | ICD-10-CM

## 2023-11-12 NOTE — Telephone Encounter (Signed)
 Pt states his headaches have worsen within the last week, he would like to discuss with RN

## 2023-11-12 NOTE — Patient Instructions (Addendum)
 Below is our plan:  We will continue propranolol and gabapentin for now. Continue sumatriptan We will order an EEG to assess for any seizure activity. Continue discussion with PCP regarding the syncopal events.   Please make sure you are staying well hydrated. I recommend 50-60 ounces daily. Well balanced diet and regular exercise encouraged. Consistent sleep schedule with 6-8 hours recommended.   Please continue follow up with care team as directed.   Follow up with Dr Teresa Coombs pending EEG  You may receive a survey regarding today's visit. I encourage you to leave honest feed back as I do use this information to improve patient care. Thank you for seeing me today!   GENERAL HEADACHE INFORMATION:   Natural supplements: Magnesium Oxide or Magnesium Glycinate 500 mg at bed (up to 800 mg daily) Coenzyme Q10 300 mg in AM Vitamin B2- 200 mg twice a day   Add 1 supplement at a time since even natural supplements can have undesirable side effects. You can sometimes buy supplements cheaper (especially Coenzyme Q10) at www.WebmailGuide.co.za or at [facility].  Migraine with aura: There is increased risk for stroke in women with migraine with aura and a contraindication for the combined contraceptive pill for use by women who have migraine with aura. The risk for women with migraine without aura is lower. However other risk factors like smoking are far more likely to increase stroke risk than migraine. There is a recommendation for no smoking and for the use of OCPs without estrogen such as progestogen only pills particularly for women with migraine with aura.Marland Kitchen People who have migraine headaches with auras may be 3 times more likely to have a stroke caused by a blood clot, compared to migraine patients who don't see auras. Women who take hormone-replacement therapy may be 30 percent more likely to suffer a clot-based stroke than women not taking medication containing estrogen. Other risk factors like smoking and high  blood pressure may be  much more important.    Vitamins and herbs that show potential:   Magnesium: Magnesium (250 mg twice a day or 500 mg at bed) has a relaxant effect on smooth muscles such as blood vessels. Individuals suffering from frequent or daily headache usually have low magnesium levels which can be increase with daily supplementation of 400-750 mg. Three trials found 40-90% average headache reduction  when used as a preventative. Magnesium may help with headaches are aura, the best evidence for magnesium is for migraine with aura is its thought to stop the cortical spreading depression we believe is the pathophysiology of migraine aura.Magnesium also demonstrated the benefit in menstrually related migraine.  Magnesium is part of the messenger system in the serotonin cascade and it is a good muscle relaxant.  It is also useful for constipation which can be a side effect of other medications used to treat migraine. Good sources include nuts, whole grains, and tomatoes. Side Effects: loose stool/diarrhea  Riboflavin (vitamin B 2) 200 mg twice a day. This vitamin assists nerve cells in the production of ATP a principal energy storing molecule.  It is necessary for many chemical reactions in the body.  There have been at least 3 clinical trials of riboflavin using 400 mg per day all of which suggested that migraine frequency can be decreased.  All 3 trials showed significant improvement in over half of migraine sufferers.  The supplement is found in bread, cereal, milk, meat, and poultry.  Most Americans get more riboflavin than the recommended daily allowance, however riboflavin  deficiency is not necessary for the supplements to help prevent headache. Side effects: energizing, green urine   Coenzyme Q10: This is present in almost all cells in the body and is critical component for the conversion of energy.  Recent studies have shown that a nutritional supplement of CoQ10 can reduce the frequency of  migraine attacks by improving the energy production of cells as with riboflavin.  Doses of 150 mg twice a day have been shown to be effective.   Melatonin: Increasing evidence shows correlation between melatonin secretion and headache conditions.  Melatonin supplementation has decreased headache intensity and duration.  It is widely used as a sleep aid.  Sleep is natures way of dealing with migraine.  A dose of 3 mg is recommended to start for headaches including cluster headache. Higher doses up to 15 mg has been reviewed for use in Cluster headache and have been used. The rationale behind using melatonin for cluster is that many theories regarding the cause of Cluster headache center around the disruption of the normal circadian rhythm in the brain.  This helps restore the normal circadian rhythm.   HEADACHE DIET: Foods and beverages which may trigger migraine Note that only 20% of headache patients are food sensitive. You will know if you are food sensitive if you get a headache consistently 20 minutes to 2 hours after eating a certain food. Only cut out a food if it causes headaches, otherwise you might remove foods you enjoy! What matters most for diet is to eat a well balanced healthy diet full of vegetables and low fat protein, and to not miss meals.   Chocolate, other sweets ALL cheeses except cottage and cream cheese Dairy products, yogurt, sour cream, ice cream Liver Meat extracts (Bovril, Marmite, meat tenderizers) Meats or fish which have undergone aging, fermenting, pickling or smoking. These include: Hotdogs,salami,Lox,sausage, mortadellas,smoked salmon, pepperoni, Pickled herring Pods of broad bean (English beans, Chinese pea pods, [place] (fava) beans, lima and navy beans Ripe avocado, ripe banana Yeast extracts or active yeast preparations such as Brewer's or Fleishman's (commercial bakes goods are permitted) Tomato based foods, pizza (lasagna, etc.)   MSG (monosodium glutamate)  is disguised as many things; look for these common aliases: Monopotassium glutamate Autolysed yeast Hydrolysed protein Sodium caseinate "flavorings" "all natural preservatives" Nutrasweet   Avoid all other foods that convincingly provoke headaches.   Resources: The Dizzy [place] Laundry Your Headache Diet, migrainestrong.com  https://zamora-andrews.com/   Caffeine and Migraine For patients that have migraine, caffeine intake more than 3 days per week can lead to dependency and increased migraine frequency. I would recommend cutting back on your caffeine intake as best you can. The recommended amount of caffeine is 200-300 mg daily, although migraine patients may experience dependency at even lower doses. While you may notice an increase in headache temporarily, cutting back will be helpful for headaches in the long run. For more information on caffeine and migraine, visit: https://americanmigrainefoundation.org/resource-library/caffeine-and-migraine/   Headache Prevention Strategies:   1. Maintain a headache diary; learn to identify and avoid triggers.  - This can be a simple note where you log when you had a headache, associated symptoms, and medications used - There are several smartphone apps developed to help track migraines: Migraine Buddy, Migraine Monitor, Curelator N1-Headache App   Common triggers include: Emotional triggers: Emotional/Upset family or friends Emotional/Upset occupation Business reversal/success Anticipation anxiety Crisis-serious Post-crisis periodNew job/position   Physical triggers: Vacation Day Weekend Strenuous Exercise High Altitude Location New Move Menstrual Day Physical Illness  Oversleep/Not enough sleep Weather changes Light: Photophobia or light sesnitivity treatment involves a balance between desensitization and reduction in overly strong input. Use dark polarized glasses outside, but not inside.  Avoid bright or fluorescent light, but do not dim environment to the point that going into a normally lit room hurts. Consider FL-41 tint lenses, which reduce the most irritating wavelengths without blocking too much light.  These can be obtained at axonoptics.com or theraspecs.com Foods: see list above.   2. Limit use of acute treatments (over-the-counter medications, triptans, etc.) to no more than 2 days per week or 10 days per month to prevent medication overuse headache (rebound headache).     3. Follow a regular schedule (including weekends and holidays): Don't skip meals. Eat a balanced diet. 8 hours of sleep nightly. Minimize stress. Exercise 30 minutes per day. Being overweight is associated with a 5 times increased risk of chronic migraine. Keep well hydrated and drink 6-8 glasses of water per day.   4. Initiate non-pharmacologic measures at the earliest onset of your headache. Rest and quiet environment. Relax and reduce stress. Breathe2Relax is a free app that can instruct you on    some simple relaxtion and breathing techniques. Http://Dawnbuse.com is a    free website that provides teaching videos on relaxation.  Also, there are  many apps that   can be downloaded for "mindful" relaxation.  An app called YOGA NIDRA will help walk you through mindfulness. Another app called Calm can be downloaded to give you a structured mindfulness guide with daily reminders and skill development. Headspace for guided meditation Mindfulness Based Stress Reduction Online Course: www.palousemindfulness.com Cold compresses.   5. Don't wait!! Take the maximum allowable dosage of prescribed medication at the first sign of migraine.   6. Compliance:  Take prescribed medication regularly as directed and at the first sign of a migraine.   7. Communicate:  Call your physician when problems arise, especially if your headaches change, increase in frequency/severity, or become associated with neurological  symptoms (weakness, numbness, slurred speech, etc.). Proceed to emergency room if you experience new or worsening symptoms or symptoms do not resolve, if you have new neurologic symptoms or if headache is severe, or for any concerning symptom.   8. Headache/pain management therapies: Consider various complementary methods, including medication, behavioral therapy, psychological counselling, biofeedback, massage therapy, acupuncture, dry needling, and other modalities.  Such measures may reduce the need for medications. Counseling for pain management, where patients learn to function and ignore/minimize their pain, seems to work very well.   9. Recommend changing family's attention and focus away from patient's headaches. Instead, emphasize daily activities. If first question of day is 'How are your headaches/Do you have a headache today?', then patient will constantly think about headaches, thus making them worse. Goal is to re-direct attention away from headaches, toward daily activities and other distractions.   10. Helpful Websites: www.AmericanHeadacheSociety.org PatentHood.ch www.headaches.org TightMarket.nl www.achenet.org  Management of Memory Problems   There are some general things you can do to help manage your memory problems.  Your memory may not in fact recover, but by using techniques and strategies you will be able to manage your memory difficulties better.   1)  Establish a routine. Try to establish and then stick to a regular routine.  By doing this, you will get used to what to expect and you will reduce the need to rely on your memory.  Also, try to do things at the same time of day,  such as taking your medication or checking your calendar first thing in the morning. Think about think that you can do as a part of a regular routine and make a list.  Then enter them into a daily planner to remind you.  This will help you establish a routine.   2)  Organize your  environment. Organize your environment so that it is uncluttered.  Decrease visual stimulation.  Place everyday items such as keys or cell phone in the same place every day (ie.  Basket next to front door) Use post it notes with a brief message to yourself (ie. Turn off light, lock the door) Use labels to indicate where things go (ie. Which cupboards are for food, dishes, etc.) Keep a notepad and pen by the telephone to take messages   3)  Memory Aids A diary or journal/notebook/daily planner Making a list (shopping list, chore list, to do list that needs to be done) Using an alarm as a reminder (kitchen timer or cell phone alarm) Using cell phone to store information (Notes, Calendar, Reminders) Calendar/White board placed in a prominent position Post-it notes   In order for memory aids to be useful, you need to have good habits.  It's no good remembering to make a note in your journal if you don't remember to look in it.  Try setting aside a certain time of day to look in journal.   4)  Improving mood and managing fatigue. There may be other factors that contribute to memory difficulties.  Factors, such as anxiety, depression and tiredness can affect memory. Regular gentle exercise can help improve your mood and give you more energy. Exercise: there are short videos created by the [employer] on Health specially for older adults: https://bit.ly/2I30q97.  Mediterranean diet: which emphasizes fruits, vegetables, whole grains, legumes, fish, and other seafood; unsaturated fats such as olive oils; and low amounts of red meat, eggs, and sweets. A variation of this, called MIND (Mediterranean-DASH Intervention for Neurodegenerative Delay) incorporates the DASH (Dietary Approaches to Stop Hypertension) diet, which has been shown to lower high blood pressure, a risk factor for Alzheimer's disease. More information at:  ExitMarketing.de.  Aerobic exercise that improve heart health is also good for the mind.  [employer] on Aging have short videos for exercises that you can do at home: BlindWorkshop.com.pt Simple relaxation techniques may help relieve symptoms of anxiety Try to get back to completing activities or hobbies you enjoyed doing in the past. Learn to pace yourself through activities to decrease fatigue. Find out about some local support groups where you can share experiences with others. Try and achieve 7-8 hours of sleep at night.   Tasks to improve attention/working memory 1. Good sleep hygiene (7-8 hrs of sleep) 2. Learning a new skill (Painting, Carpentry, Pottery, new language, Knitting). 3.Cognitive exercises (keep a daily journal, Puzzles) 4. Physical exercise and training  (30 min/day X 4 days week) 5. Being on Antidepressant if needed 6.Yoga, Meditation, Tai Chi 7. Decrease alcohol intake 8.Have a clear schedule and structure in daily routine   MIND Diet: The Mediterranean-DASH Diet Intervention for Neurodegenerative Delay, or MIND diet, targets the health of the aging brain. Research participants with the highest MIND diet scores had a significantly slower rate of cognitive decline compared with those with the lowest scores. The effects of the MIND diet on cognition showed greater effects than either the Mediterranean or the DASH diet alone.   The healthy items the MIND diet guidelines suggest include:  3+ servings a day of whole grains 1+ servings a day of vegetables (other than green leafy) 6+ servings a week of green leafy vegetables 5+ servings a week of nuts 4+ meals a week of beans 2+ servings a week of berries 2+ meals a week of poultry 1+ meals a week of fish Mainly olive oil if added fat is used   The unhealthy items, which are higher in saturated and trans fat, include: Less than 5  servings a week of pastries and sweets Less than 4 servings a week of red meat (including beef, pork, lamb, and products made from these meats) Less than one serving a week of cheese and fried foods Less than 1 tablespoon a day of butter/stick margarine

## 2023-11-12 NOTE — Progress Notes (Signed)
 Chief Complaint  Patient presents with   Follow-up    Pt in 1 alone Pt here for increased headaches and dizziness     HISTORY OF PRESENT ILLNESS:  11/12/23 ALL:  Eddie Valentine returns for follow up for headaches. He was last seen by Dr Delena Bali 04/2023 and doing well on propranolol LA 60mg  daily and gabapentin 100/100/400mg . Nurtec started and sumatriptan continued for abortive therapy.  Since, he reports doing well until the past 2-3 weeks. He reports having about 3 mild headaches per week. Unclear how often he is taking sumatriptan. Doesn't remember taking it recently. He does not fill it every month. It does seem to help.   He reports about 5 separate events of "passing out" that have occurred over the past 6-8 months. He reports that most all are after eating [employer]. All events have been when in the bed. He reports feeling dizzy then loses consciousness for 3-4 hours. He wakes feeling normal. No episodes of incontinence or tongue injuries.   He has hx of seizures. He has been followed by [facility] neurology for years but has not been seen, recently. He tells me he had three seizures events. One in 1999, 2000, 2001. All three were events of violent behavior. Mostly hitting. No seizures since. He has taken carbamazepine and lev in past. Felicity Coyer caused worsening startle reflex. DBS in 2009 for right arm dystonia. Botox helped for approx 2 months. CT 05/2023 unremarkable.   He does not drive. Lives alone. Dystonic gait. He falls a couple times a month. Does not use assistive device. He endorses significant stress at work and in life. He denies SI. He was referred to new psychiatrist 10/2023.   PCP reportedly aware of passing out events. He was last seen 10/31/23. I do not see notes regarding complaints in Epic. He was referred to new psychiatrist last week. 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    05/13/2023 JC: 33 year old male with a history of depression, GAD, cerebral palsy s/p deep  brain stimulator who follows in clinic for headaches following a concussion in July 2022. CTH at that time showed no acute process.    Interval History: Headaches are relatively well-controlled on propranolol and gabapentin. Gabapentin was increased to 100/100/400. He likes this dose but would prefer for Neurology to prescribe this instead of Psychiatry. He averages one migraine every 1-2 weeks. Imitrex continues to help, but makes him feel foggy.   He had a couple of unwitnessed falls this year due to his cerebral palsy. Last fall was in July. He is not sure if he hit his head but states he was "out for a while". Did not seek medical evaluation. Dizziness has been worse since his fall.   Migraine days per month: 4 Headache free days per month: 22   Current Headache Regimen: Preventative: propranolol 60 mg daily, gabapentin 100/100/400 Abortive: Imitrex 50 mg PRN   Prior Therapies                                  Gabapentin 100/100/400 Propranolol 20 mg BID Maxalt 10 mg PRN - drowsiness, cognitive slowing Imitrex 50 mg PRN - fogginess Zanaflex 2 mg TID  04/23/2022 ALL:  Eddie Valentine is a 33 y.o. male here today for follow up for migraines. He was last seen by Dr Delena Bali 09/2021. Propranolol was increased to 60mg  daily and gabapentin 100mg   TID continued. Sumatriptan 50mg  PRN used for abortive therapy. Since, he reports headaches are improved. He may have 4 headache days a month. He has not had to use sumatriptan in months.   He was referred to speech therapy for cognitive rehab following a concussion 02/2021. He completed therapy. He has been using reminders on his phone to help with medication administration and checking mailbox. He is followed by psychiatry. He takes Trintellix 20mg  daily and clonazepam 0.5mg  2-3 times daily. He works full time in Civil Service fast streamer for [employer]. He lives alone. He does not drive.    HISTORY (copied from Dr Quentin Mulling previous note)  Brief HPI: 33 year old male with a  history of depression, GAD, cerebral palsy s/p deep brain stimulator who follows in clinic for headaches following a concussion in July 2022. CTH showed no acute process.   At his last visit he was started on propranolol for headache prevention and Imitrex for rescue.   Interval History: Since his last visit he has had a decrease in headache frequency. He is down to one migraine per week. Imitrex resolves his migraine when he takes it for breakthrough migraines.   Feels his short term memory is still not back to baseline since before his concussion.   Headache days per month: 4 Headache free days per month: 26   Current Headache Regimen: Preventative: propranolol 20 mg BID, gabapentin 100 mg TID Abortive: Imitrex 50 mg as needed   Prior Therapies                                  Gabapentin 100 mg TID Propranolol 20 mg BID Maxalt 10 mg PRN - drowsiness, cognitive slowing Imitrex 50 mg PRN Zanaflex 2 mg TID   REVIEW OF SYSTEMS: Out of a complete 14 system review of symptoms, the patient complains only of the following symptoms, headaches, anxiety, depresison, gait abnormalities, dystonia and all other reviewed systems are negative.   ALLERGIES: Allergies  Allergen Reactions   Oxybutynin Other (See Comments)    hallucinations       HOME MEDICATIONS: Outpatient Medications Prior to Visit  Medication Sig Dispense Refill   clonazePAM (KLONOPIN) 0.5 MG tablet TAKE 1 TABLET BY MOUTH 4 TIMES A DAY AS NEEDED FOR ANXIETY (Patient taking differently: Take 0.5 mg by mouth 3 (three) times daily as needed for anxiety.) 120 tablet 0   gabapentin (NEURONTIN) 100 MG capsule Take 100 mg in the morning, 100 mg in the afternoon, and 400 mg at bedtime 180 capsule 6   propranolol ER (INDERAL LA) 60 MG 24 hr capsule Take 1 capsule (60 mg total) by mouth daily. 90 capsule 3   Rimegepant Sulfate (NURTEC) 75 MG TBDP Take 1 tablet (75 mg total) by mouth as needed. 8 tablet 6   SUMAtriptan (IMITREX) 50  MG tablet Take 1 tablet (50 mg total) by mouth every 2 (two) hours as needed for migraine. May repeat in 2 hours if headache persists or recurs. 10 tablet 11   tiZANidine (ZANAFLEX) 2 MG tablet Take 2 mg by mouth 3 (three) times daily.     No facility-administered medications prior to visit.     PAST MEDICAL HISTORY: Past Medical History:  Diagnosis Date   Anxiety    Anxiety and depression    Cerebral palsy (HCC)    Depression    GAD (generalized anxiety disorder)    Post concussion syndrome    Seizure (  HCC)    isolated 2001     PAST SURGICAL HISTORY: Past Surgical History:  Procedure Laterality Date   DEEP BRAIN STIMULATOR PLACEMENT  2009     FAMILY HISTORY: Family History  Problem Relation Age of Onset   Other Mother        APC increased risk allele [id number]   Rheum arthritis Father    Other Father        Father recently died of recurrent MRSA infections apparently   Other Maternal Grandmother        APC increased risk allele [id number]   Pancreatic cancer Maternal Grandmother 29   Lung cancer Paternal Grandmother 44       smoked   Prostate cancer Paternal Grandfather 71       metastatic     SOCIAL HISTORY: Social History   Socioeconomic History   Marital status: Single    Spouse name: Not on file   Number of children: 0   Years of education: Not on file   Highest education level: Bachelor's degree (e.g., BA, AB, BS)  Occupational History   Occupation: [employer]    Comment: marketing/degree in Audiological scientist  Tobacco Use   Smoking status: Former    Types: Cigarettes   Smokeless tobacco: Never  Vaping Use   Vaping status: Never Used  Substance and Sexual Activity   Alcohol use: Yes    Comment: occ beer   Drug use: Not Currently    Types: Marijuana    Comment: rare   Sexual activity: Not on file  Other Topics Concern   Not on file  Social History Narrative   Decaf coffee   Pt lives alone    Pt works    Social Drivers of Research scientist (physical sciences) Strain: Not on file  Food Insecurity: Low Risk  (05/20/2023)   Received from [facility]   Hunger Vital Sign    Worried About Running Out of Food in the Last Year: Never true    Ran Out of Food in the Last Year: Never true  Transportation Needs: No Transportation Needs (05/20/2023)   Received from [facility]    In the past 12 months, has lack of reliable transportation kept you from medical appointments, meetings, work or from getting things needed for daily living? : No  Physical Activity: Inactive (06/18/2018)   Exercise Vital Sign    Days of Exercise per Week: 0 days    Minutes of Exercise per Session: 0 min  Stress: Stress Concern Present (06/18/2018)   Eddie Valentine [employer] Occupational [employer] - Occupational Stress Questionnaire    Feeling of Stress : To some extent  Social Connections: Somewhat Isolated (06/18/2018)   Social Connection and Isolation Panel [NHANES]    Frequency of Communication with Friends and Family: More than three times a week    Frequency of Social Gatherings with Friends and Family: Three times a week    Attends Religious Services: 1 to 4 times per year    Active Member of Clubs or Organizations: No    Attends Banker Meetings: Never    Marital Status: Never married  Intimate Partner Violence: Not on file     PHYSICAL EXAM  Vitals:   11/12/23 1101  BP: 119/74  Pulse: 82  Weight: 122 lb 8 oz (55.6 kg)  Height: 5\' 7"  (1.702 m)    Body mass index is 19.19 kg/m.  Generalized: Well developed, in no acute distress  Cardiology: normal  rate and rhythm, no murmur auscultated  Respiratory: clear to auscultation bilaterally    Neurological examination  Mentation: Alert oriented to time, place, history taking. Follows all commands, speech is dysarthic with spastic voice Cranial nerve II-XII: Pupils were equal round reactive to light. Extraocular movements were full, visual field were full on confrontational  test. Facial sensation and strength were normal. Head turning and shoulder shrug  were normal and symmetric. Motor: The motor testing reveals 5 over 5 strength of all 4 extremities. Spasticity of all ext. Severe dystonia of right upper ext. Increased tone in upper and lower ext.  Gait and station: Gait wide and spastic/dystonic gait, unsteady, no assistive device with him, today. Using wheelchair to get out of office. Unable to tandem   DIAGNOSTIC DATA (LABS, IMAGING, TESTING) - I reviewed patient records, labs, notes, testing and imaging myself where available.  Lab Results  Component Value Date   WBC 8.1 12/15/2020   HGB 13.7 12/15/2020   HCT 39.6 12/15/2020   MCV 94.3 12/15/2020   PLT 208 12/15/2020      Component Value Date/Time   NA 140 12/15/2020 1201   K 4.0 12/15/2020 1201   CL 107 12/15/2020 1201   CO2 27 12/15/2020 1201   GLUCOSE 83 12/15/2020 1201   BUN 22 (H) 12/15/2020 1201   CREATININE 0.89 12/15/2020 1201   CALCIUM 8.6 (L) 12/15/2020 1201   PROT 7.0 12/15/2020 1201   ALBUMIN 3.8 12/15/2020 1201   AST 28 12/15/2020 1201   ALT 21 12/15/2020 1201   ALKPHOS 42 12/15/2020 1201   BILITOT 0.8 12/15/2020 1201   GFRNONAA >60 12/15/2020 1201   GFRAA >60 03/31/2018 0421   No results found for: "CHOL", "HDL", "LDLCALC", "LDLDIRECT", "TRIG", "CHOLHDL" No results found for: "HGBA1C" No results found for: "VITAMINB12" No results found for: "TSH"      No data to display               No data to display           ASSESSMENT AND PLAN  33 y.o. year old male  has a past medical history of Anxiety, Anxiety and depression, Cerebral palsy (HCC), Depression, GAD (generalized anxiety disorder), Post concussion syndrome, and Seizure (HCC). here with    Migraine without aura and without status migrainosus, not intractable  Chronic post-traumatic headache, not intractable  Syncope, unspecified syncope type - Plan: EEG adult  Altered awareness, transient - Plan:  EEG adult  History of seizure - Plan: EEG adult  Anxiety and depression  Eddie Valentine reports that headaches have been well managed until the past few weeks. He is under more stress and not staying well hydrated. We will continue propranolol 60mg  daily and gabapentin 100mg  twice daily and 400mg  at bedtime. Continue sumatriptan as needed. He reports new events over past 6-8 months that he describes as passing out. Hx seizures but described as behavioral outbursts. We will order EEG. He does not drive. Seizure precautions advised. I have encouraged him to schedule follow up with [facility] for discussion of DBS lead removal as he is not interested in continuing therapy. He denies SI. Suicidal precautions reviewed and education materials provided. Memory compensation strategies advised. Healthy lifestyle habits encouraged. He will follow up with PCP as directed. He needs to establish care wit neurology in Dr Quentin Mulling departure. I will ask Dr Teresa Coombs to see him next but anticipate he will continue ongoing follow up with me.  He verbalizes understanding and agreement with this  plan.   Orders Placed This Encounter  Procedures   EEG adult    Standing Status:   Future    Expiration Date:   11/11/2024    Where should this test be performed?:   [facility]    Reason for exam:   Syncope    Reason for exam:   Altered mental status    Reason for exam:   Seizure     No orders of the defined types were placed in this encounter.   I spent 45 minutes of face-to-face and non-face-to-face time with patient.  This included previsit chart review, lab review, study review, order entry, electronic health record documentation, patient education.   Shawnie Dapper, MSN, FNP-C 11/12/2023, 1:15 PM  [facility] [address], [address] [place], [place] [address] [phone]

## 2023-11-12 NOTE — Telephone Encounter (Signed)
 [place].  Pt is on his way for the 11:00 appointment with Amy, NP

## 2023-11-12 NOTE — Telephone Encounter (Signed)
 Pt last saw Dr. Delena Bali 05/13/23. Next f/u 05/27/24 with Amy Lomax,NP. Appears pt cx appt 11/19/23 with Amy Lomax,NP d/t being out of state (per notes).   Called pt to try and work in today for appt. LVM for pt to call office. Was going to offer today with Amy at 11am. If not today, can offer 11/17/23 at 1:00pm with Amy

## 2023-11-12 NOTE — Telephone Encounter (Signed)
Message below noted

## 2023-11-14 ENCOUNTER — Other Ambulatory Visit: Admitting: *Deleted

## 2023-11-19 ENCOUNTER — Ambulatory Visit: Payer: Managed Care, Other (non HMO) | Admitting: Family Medicine

## 2023-11-24 ENCOUNTER — Other Ambulatory Visit: Admitting: *Deleted

## 2023-11-26 ENCOUNTER — Other Ambulatory Visit: Admitting: *Deleted

## 2023-11-26 ENCOUNTER — Encounter: Payer: Self-pay | Admitting: Family Medicine

## 2023-11-26 ENCOUNTER — Other Ambulatory Visit: Payer: Self-pay | Admitting: *Deleted

## 2023-11-26 ENCOUNTER — Ambulatory Visit: Admitting: Neurology

## 2023-11-26 DIAGNOSIS — R55 Syncope and collapse: Secondary | ICD-10-CM

## 2023-11-26 DIAGNOSIS — Z87898 Personal history of other specified conditions: Secondary | ICD-10-CM

## 2023-11-26 DIAGNOSIS — R404 Transient alteration of awareness: Secondary | ICD-10-CM

## 2023-11-26 MED ORDER — GABAPENTIN 100 MG PO CAPS: ORAL_CAPSULE | ORAL | 3 refills | Status: DC

## 2023-11-26 NOTE — Procedures (Signed)
    History:  33 year old gentleman with history of seizure   EEG classification: Awake and drowsy  Duration: 25 minutes  Technical aspects: This EEG study was done with scalp electrodes positioned according to the 10-20 International system of electrode placement. Electrical activity was reviewed with band pass filter of 1-70Hz , sensitivity of 7 uV/mm, display speed of 69mm/sec with a 60Hz  notched filter applied as appropriate. EEG data were recorded continuously and digitally stored.   Description of the recording: The background rhythms of this recording consists of a fairly well modulated medium amplitude alpha rhythm of 11 Hz that is reactive to eye opening and closure. Present in the anterior head region is a 15-20 Hz beta activity. Photic stimulation was performed, did not show any abnormalities. Hyperventilation was also performed, did not show any abnormalities. Drowsiness was manifested by background fragmentation. No abnormal epileptiform discharges seen during this recording. There was no focal slowing. There were no electrographic seizure identified.   Abnormality: None   Impression: This is a normal awake and drowsy EEG. No evidence of interictal epileptiform discharges. Normal EEGs, however, do not rule out epilepsy.    Eddie Norfolk, MD [facility]

## 2023-11-27 NOTE — Telephone Encounter (Signed)
 Spoke to patient made him aware needed to get established with Dr Teresa Coombs because Dr Delena Bali is no longer here.Made f/u appointment with Dr Teresa Coombs 04/2024  and canceled Amy,NP appointment in 02/2024  informed patient if he has any seizures or seizure like activities please go to ED to get evaluated and if he has any questions and concerns feel free to call our office Pt expressed understanding and thanked me for calling

## 2024-02-05 ENCOUNTER — Ambulatory Visit: Admitting: Family Medicine

## 2024-02-10 ENCOUNTER — Ambulatory Visit: Admitting: Family Medicine

## 2024-02-10 ENCOUNTER — Encounter: Payer: Self-pay | Admitting: Family Medicine

## 2024-02-10 NOTE — Progress Notes (Deleted)
   Established Patient Office Visit  Subjective   Patient ID: Eddie Valentine, male    DOB: September 28, 1990  Age: 33 y.o. MRN: [id number]  No chief complaint on file.   HPI Patient presents today for medication management. Reports compliance with medication regimen. Denies the need for refills. Not fasting today. Denies other concerns. Medical history as outlined below.  ROS Per HPI    Objective:     There were no vitals taken for this visit.  Physical Exam Vitals and nursing note reviewed.  Constitutional:      General: He is not in acute distress. HENT:     Head: Normocephalic and atraumatic.     Nose: Nose normal.     Mouth/Throat:     Mouth: Mucous membranes are moist.     Pharynx: Oropharynx is clear.   Eyes:     Extraocular Movements: Extraocular movements intact.     Pupils: Pupils are equal, round, and reactive to light.    Cardiovascular:     Rate and Rhythm: Normal rate and regular rhythm.     Pulses: Normal pulses.     Heart sounds: Normal heart sounds.  Pulmonary:     Effort: Pulmonary effort is normal. No respiratory distress.     Breath sounds: Normal breath sounds. No wheezing, rhonchi or rales.   Musculoskeletal:     Cervical back: Normal range of motion.     Comments: Using four-wheel rolling walker with seat for ambulation.  Contractures to bilateral upper extremities, right foot drop with ambulation  Lymphadenopathy:     Cervical: No cervical adenopathy.   Skin:    General: Skin is warm and dry.   Neurological:     Mental Status: He is alert and oriented to person, place, and time. Mental status is at baseline.     Comments: Dysphonic speech  Psychiatric:        Mood and Affect: Mood normal.        Behavior: Behavior normal.        Thought Content: Thought content normal.        Judgment: Judgment normal.    No results found for any visits on 02/10/24.   The ASCVD Risk score (Arnett DK, et al., 2019) failed to calculate for the  following reasons:   The 2019 ASCVD risk score is only valid for ages 37 to 65    Assessment & Plan:   There are no diagnoses linked to this encounter.   No follow-ups on file.    Corean LITTIE Ku, FNP

## 2024-02-12 ENCOUNTER — Other Ambulatory Visit: Payer: Self-pay | Admitting: Medical Genetics

## 2024-02-19 ENCOUNTER — Ambulatory Visit: Admitting: Family Medicine

## 2024-02-24 ENCOUNTER — Ambulatory Visit: Admitting: Family Medicine

## 2024-03-15 ENCOUNTER — Ambulatory Visit: Admitting: Family Medicine

## 2024-03-23 ENCOUNTER — Ambulatory Visit: Admitting: Neurology

## 2024-04-12 ENCOUNTER — Other Ambulatory Visit: Payer: Self-pay | Admitting: Family Medicine

## 2024-04-12 MED ORDER — GABAPENTIN 100 MG PO CAPS: ORAL_CAPSULE | ORAL | 5 refills | Status: DC

## 2024-04-12 NOTE — Telephone Encounter (Signed)
 Pharmacy called to follow up about medication refill , informed that Md is working on it . Pharmacy was calling to request medication refill for pt  gabapentin  (NEURONTIN ) 100 MG capsule   [facility] - [place], [place] - [phone] (Ph: [phone])

## 2024-04-12 NOTE — Telephone Encounter (Signed)
 Patient request refill for gabapentin  (NEURONTIN ) 100 MG capsule send to  [facility]    Returning the prescription for gabapentin  delivered on Friday because dosage was wrong, they only filled for 300 mg.

## 2024-04-12 NOTE — Telephone Encounter (Signed)
 Eddie- can you review? I updated rx to match how he should be taking. Just wanted to confirm with you first that it's ok to fill since he did pick up 300mg  04/09/24 #56 from another provider that he is saying he is going to return.   Last saw Eddie Lomax,NP 11/12/23. Next scheduled follow up 04/26/24 with Dr. Gregg. Per last note, pt should take gabapentin  100mg  as follows: gabapentin  100mg  twice daily and 400mg  at bedtime.   Rx refill last sent 11/26/23 #180 and 3 refills.   Qty should be 150/30 days or 450/90 days.  Last filled gabapentin  100mg  03/08/24 #168

## 2024-04-26 ENCOUNTER — Encounter: Payer: Self-pay | Admitting: Neurology

## 2024-04-26 ENCOUNTER — Ambulatory Visit: Admitting: Neurology

## 2024-04-26 VITALS — BP 121/82 | HR 64 | Ht 66.0 in | Wt 122.5 lb

## 2024-04-26 DIAGNOSIS — G43009 Migraine without aura, not intractable, without status migrainosus: Secondary | ICD-10-CM

## 2024-04-26 MED ORDER — PROPRANOLOL HCL ER 60 MG PO CP24: 60.0000 mg | ORAL_CAPSULE | Freq: Every day | ORAL | 3 refills | Status: AC

## 2024-04-26 MED ORDER — NURTEC 75 MG PO TBDP: 75.0000 mg | ORAL_TABLET | ORAL | 6 refills | Status: AC | PRN

## 2024-04-26 MED ORDER — SUMATRIPTAN SUCCINATE 50 MG PO TABS: 50.0000 mg | ORAL_TABLET | ORAL | 11 refills | Status: AC | PRN
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
## 2024-04-26 NOTE — Patient Instructions (Signed)
 Continue propranolol  XR 60 mg and gabapentin  100/100/400 mg as preventive medications for headaches Continue with Imitrex  and Nurtec as needed Continue with physical therapy, informed patient that he needs to do his own physical therapy since he will already know all the exercises Continue to use assistive device with ambulation to avoid falls Follow-up in 1 year or sooner if worse.

## 2024-05-27 ENCOUNTER — Ambulatory Visit: Admitting: Family Medicine

## 2024-05-29 ENCOUNTER — Other Ambulatory Visit: Payer: Self-pay | Admitting: Medical Genetics

## 2024-05-29 DIAGNOSIS — Z006 Encounter for examination for normal comparison and control in clinical research program: Secondary | ICD-10-CM

## 2024-08-05 ENCOUNTER — Other Ambulatory Visit: Payer: Self-pay

## 2024-08-05 MED ORDER — GABAPENTIN 100 MG PO CAPS: ORAL_CAPSULE | ORAL | 5 refills | Status: AC

## 2025-04-28 ENCOUNTER — Ambulatory Visit: Admitting: Neurology
